# Patient Record
Sex: Female | Born: 1940 | Race: White | Hispanic: No | Smoking: Former smoker
Health system: Southern US, Community
[De-identification: ages and names within clinical notes are randomized; demographics above are authoritative.]

## PROBLEM LIST (undated history)

## (undated) DIAGNOSIS — R112 Nausea with vomiting, unspecified: Secondary | ICD-10-CM

## (undated) DIAGNOSIS — I10 Essential (primary) hypertension: Secondary | ICD-10-CM

## (undated) DIAGNOSIS — T7840XA Allergy, unspecified, initial encounter: Secondary | ICD-10-CM

## (undated) DIAGNOSIS — C801 Malignant (primary) neoplasm, unspecified: Secondary | ICD-10-CM

## (undated) DIAGNOSIS — E039 Hypothyroidism, unspecified: Secondary | ICD-10-CM

## (undated) DIAGNOSIS — R7303 Prediabetes: Secondary | ICD-10-CM

## (undated) DIAGNOSIS — Z9889 Other specified postprocedural states: Secondary | ICD-10-CM

## (undated) DIAGNOSIS — M1990 Unspecified osteoarthritis, unspecified site: Secondary | ICD-10-CM

## (undated) DIAGNOSIS — K8020 Calculus of gallbladder without cholecystitis without obstruction: Secondary | ICD-10-CM

## (undated) DIAGNOSIS — G4730 Sleep apnea, unspecified: Secondary | ICD-10-CM

## (undated) HISTORY — PX: OTHER SURGICAL HISTORY: SHX169

## (undated) HISTORY — PX: BREAST SURGERY: SHX581

## (undated) HISTORY — PX: THYROIDECTOMY: SHX17

## (undated) HISTORY — DX: Allergy, unspecified, initial encounter: T78.40XA

## (undated) HISTORY — DX: Malignant (primary) neoplasm, unspecified: C80.1

## (undated) HISTORY — PX: TONSILLECTOMY: SUR1361

---

## 1998-01-06 ENCOUNTER — Other Ambulatory Visit: Admission: RE | Admit: 1998-01-06 | Discharge: 1998-01-06 | Payer: Self-pay | Admitting: Family Medicine

## 1998-02-04 ENCOUNTER — Other Ambulatory Visit: Admission: RE | Admit: 1998-02-04 | Discharge: 1998-02-04 | Payer: Self-pay | Admitting: Obstetrics & Gynecology

## 1999-02-23 ENCOUNTER — Other Ambulatory Visit: Admission: RE | Admit: 1999-02-23 | Discharge: 1999-02-23 | Payer: Self-pay | Admitting: *Deleted

## 2003-10-28 ENCOUNTER — Other Ambulatory Visit: Admission: RE | Admit: 2003-10-28 | Discharge: 2003-10-28 | Payer: Self-pay | Admitting: Internal Medicine

## 2005-10-31 ENCOUNTER — Other Ambulatory Visit: Admission: RE | Admit: 2005-10-31 | Discharge: 2005-10-31 | Payer: Self-pay | Admitting: Internal Medicine

## 2008-05-30 ENCOUNTER — Encounter: Admission: RE | Admit: 2008-05-30 | Discharge: 2008-05-30 | Payer: Self-pay | Admitting: Endocrinology

## 2009-10-23 IMAGING — US US SOFT TISSUE HEAD/NECK
1 series · 14 of 25 positions shown · non-contrast
Comparison: None

CLINICAL DATA: Thyroid nodule

THYROID ULTRASOUND
TECHNIQUE: Ultrasound examination of the thyroid gland and
adjacent soft tissues was performed.

[Series 1: us soft tissue head/neck · 0.07mm/px · 14 of 29 slices shown]
[im 1/29]
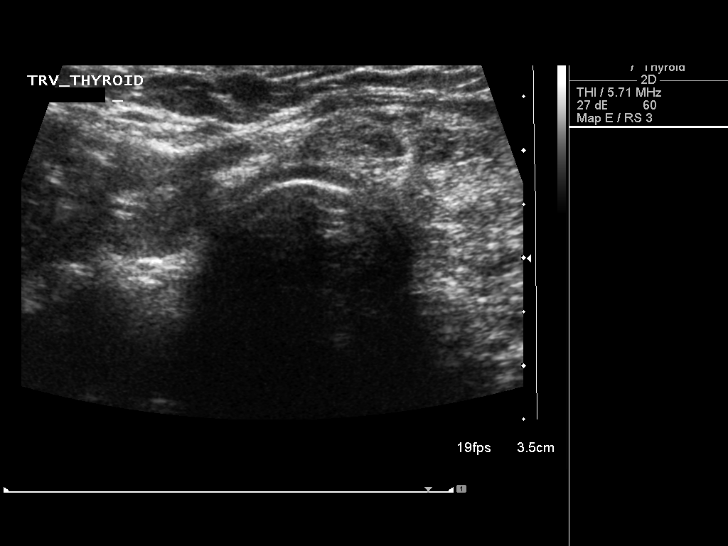
[im 3/29]
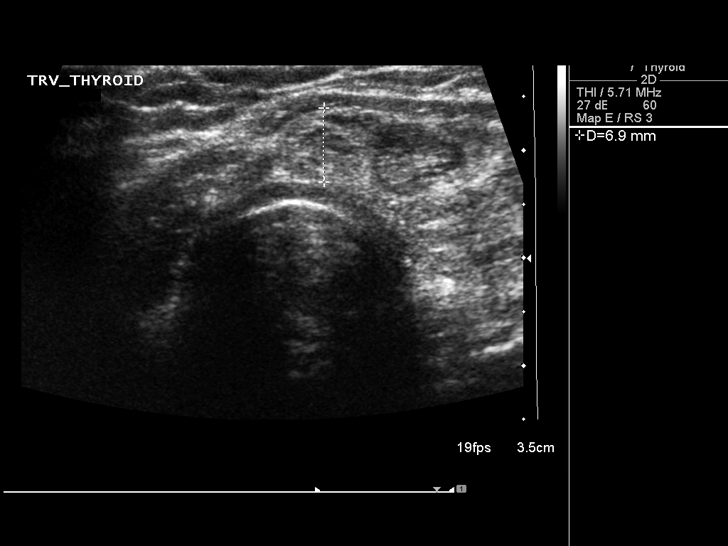
[im 5/29]
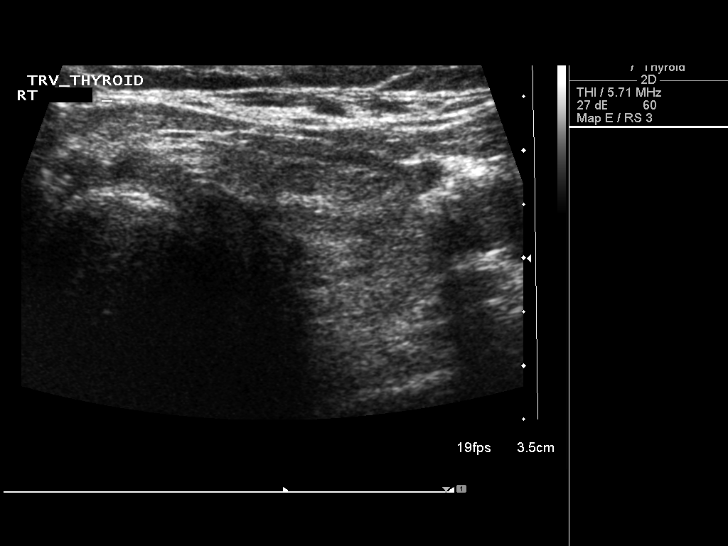
[im 8/29]
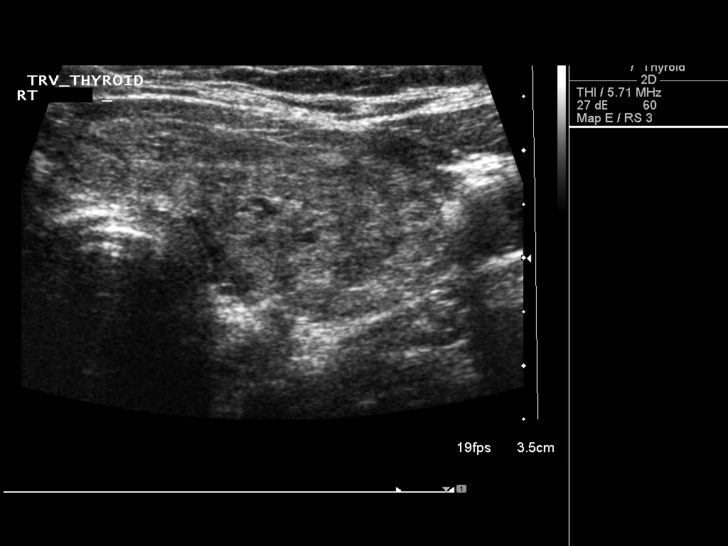
[im 10/29]
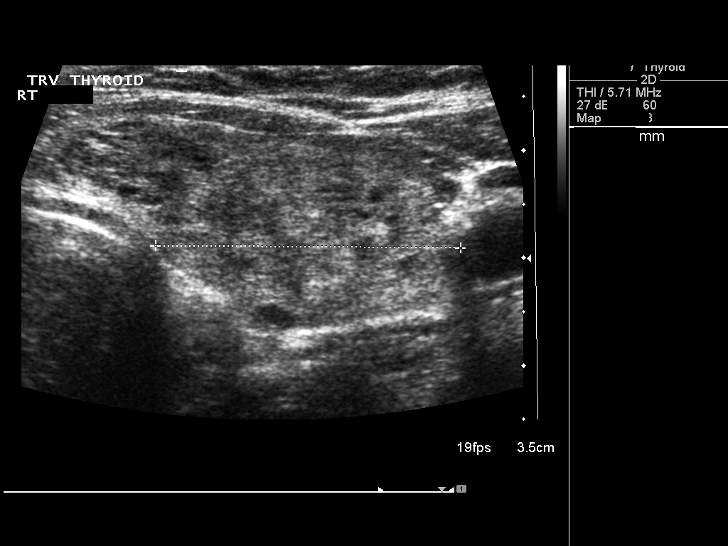
[im 11/29]
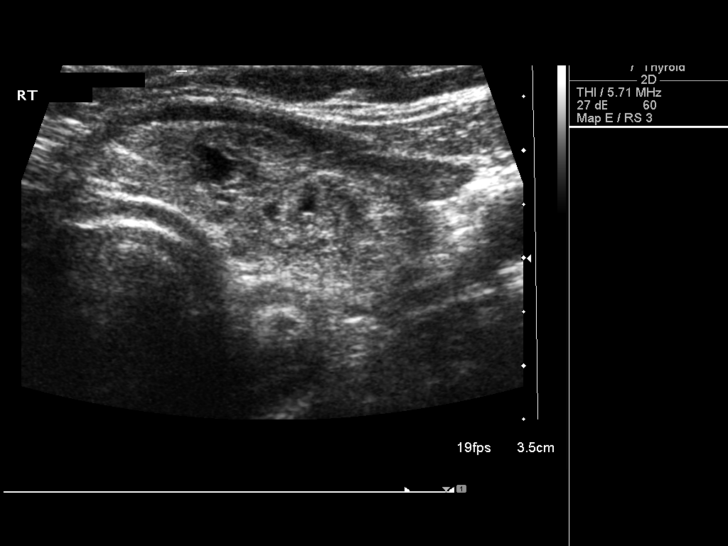
[im 13/29]
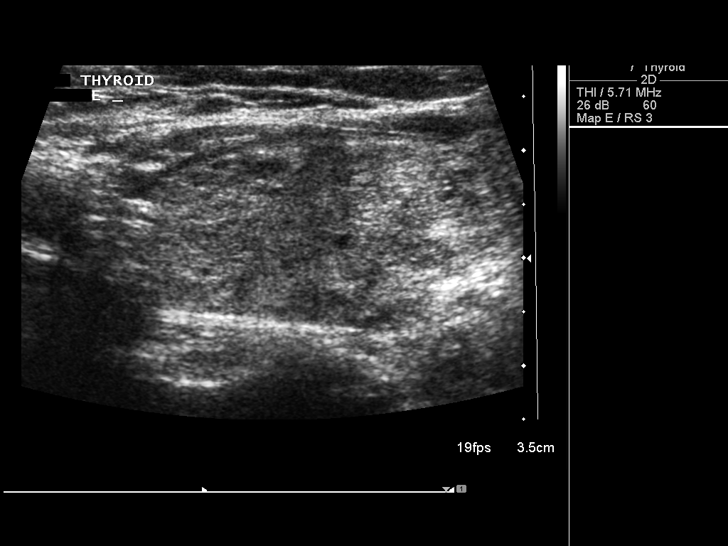
[im 16/29]
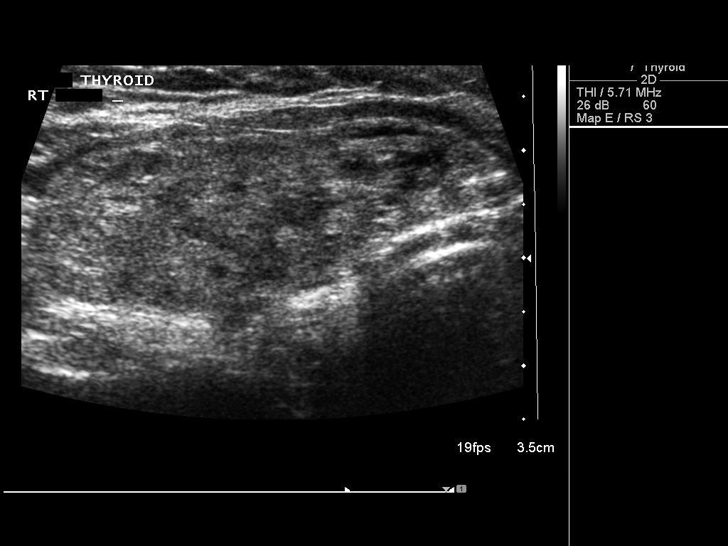
[im 18/29]
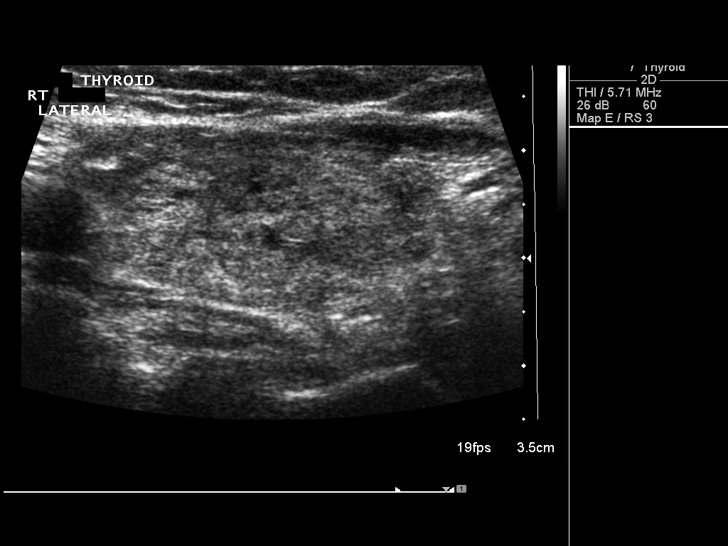
[im 19/29]
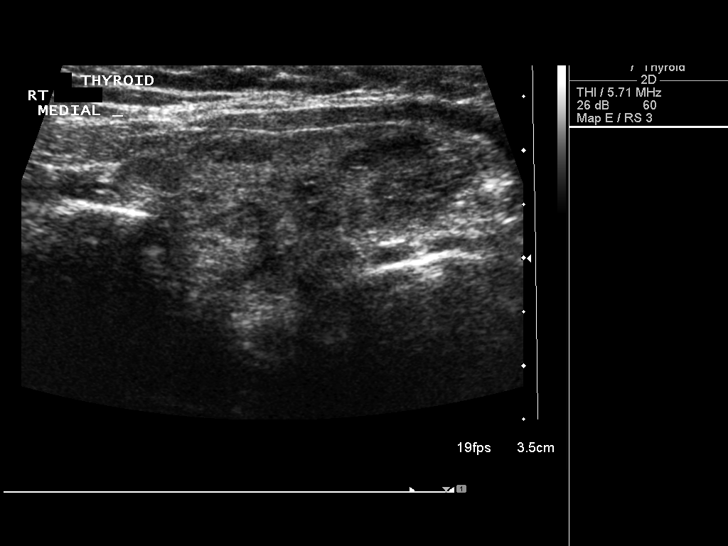
[im 22/29]
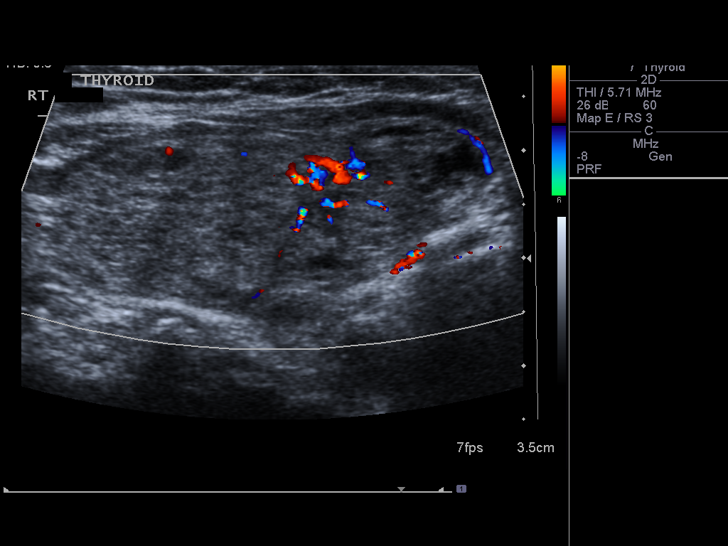
[im 24/29]
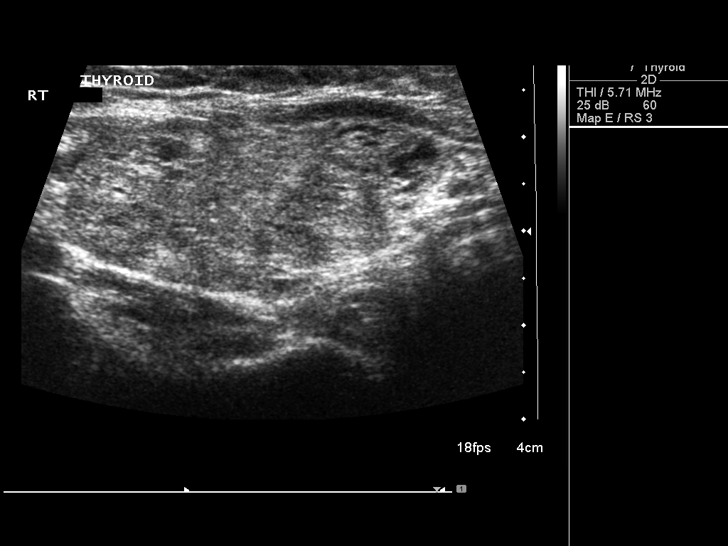
[im 26/29]
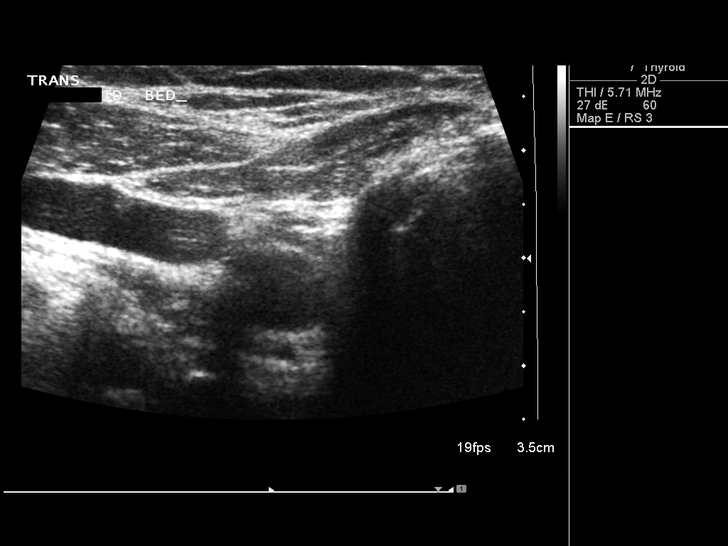
[im 29/29]
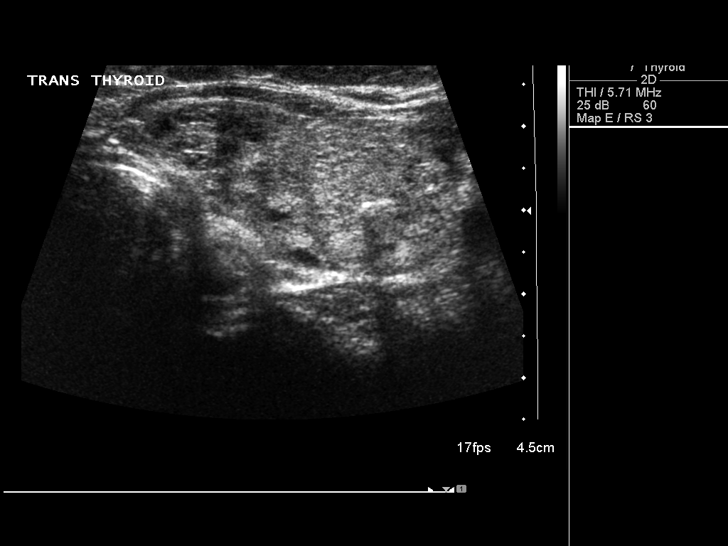

[14 of 25 positions shown; findings below may reference images not displayed]

FINDINGS: The patient had a right lobe excision in 3995.  The left
lobe measures 4.5 x 1.7 x 2.8 cm.  The isthmus measures 7 mm.  The
left lobe is very heterogeneous in appearance but no focal discrete
nodule is seen.  In the region of the right thyroid bed no mass or
nodule is identified.
IMPRESSION: 1.  Status post right lobe thyroidectomy.  No worrisome lesions are
seen in the right thyroid bed.
2.  A very heterogeneous appearance of a slightly enlarged left
lobe.  No discrete focal nodule.

## 2010-08-15 ENCOUNTER — Encounter: Payer: Self-pay | Admitting: Internal Medicine

## 2011-10-03 ENCOUNTER — Ambulatory Visit (INDEPENDENT_AMBULATORY_CARE_PROVIDER_SITE_OTHER): Payer: Medicare Other | Admitting: Family Medicine

## 2011-10-03 VITALS — BP 159/81 | HR 81 | Temp 98.6°F | Resp 18 | Ht 65.2 in | Wt 224.0 lb

## 2011-10-03 DIAGNOSIS — M542 Cervicalgia: Secondary | ICD-10-CM

## 2011-10-03 DIAGNOSIS — S139XXA Sprain of joints and ligaments of unspecified parts of neck, initial encounter: Secondary | ICD-10-CM

## 2011-10-03 DIAGNOSIS — I10 Essential (primary) hypertension: Secondary | ICD-10-CM

## 2011-10-03 DIAGNOSIS — E039 Hypothyroidism, unspecified: Secondary | ICD-10-CM

## 2011-10-03 MED ORDER — CYCLOBENZAPRINE HCL 10 MG PO TABS: 10.0000 mg | ORAL_TABLET | Freq: Two times a day (BID) | ORAL | Status: AC | PRN

## 2011-10-03 NOTE — Progress Notes (Signed)
  Patient Name: Mercedes Khan Date of Birth: 1941-05-30 Medical Record Number: [id number] Gender: female Date of Encounter: 10/03/2011  History of Present Illness:  Mercedes Khan is a 71 y.o. very pleasant female patient who presents with the following:  Has been sleeping on a couch for a few months due to moving in with her daughter and bedroom being set- up. Notes left sided neck pain and upper back/ shoulder pain for the past week.  Has tried massage and ibuprofen which did not help.  Has history of neck arthritis. No acute injury.  Over the weekend she did move to sleeping in a bed again.   Pain causes it to be difficult for her to get comfortable, especially when she lies down at night. No weakness or paraesthesias.    Otherwise she is doing ok and is compliant with her HTN medications- states she has white coat syndrome  Patient Active Problem List  Diagnoses  . Hypertension  . Hypothyroidism   Past Medical History  Diagnosis Date  . Allergy   . Cancer     skin   History reviewed. No pertinent past surgical history. History  Substance Use Topics  . Smoking status: Former Smoker    Quit date: 07/25/1972  . Smokeless tobacco: Never Used  . Alcohol Use: No   History reviewed. No pertinent family history. No Known Allergies  Medication list has been reviewed and updated.  Review of Systems: As per HPI- otherwise negative.   Physical Examination: Filed Vitals:   10/03/11 0939  BP: 159/81  Pulse: 81  Temp: 98.6 F (37 C)  TempSrc: Oral  Resp: 18  Height: 5' 5.2" (1.656 m)  Weight: 224 lb (101.606 kg)   Recheck BP 145/90  Body mass index is 37.05 kg/(m^2).  GEN: WDWN, NAD, Non-toxic, A & O x 3, obese HEENT: Atraumatic, Normocephalic. Neck supple. No masses, No LAD.  TM, oropharynx wnl Neck: no swelling or redness, normal ROM laterally, and flex/ extension.  She notes an area of tenderness and tightness to the right of C6/7.  There is no rash or lesion.     Ears and Nose: No external deformity. CV: RRR, No M/G/R. No JVD. No thrill. No extra heart sounds. PULM: CTA B, no wheezes, crackles, rhonchi. No retractions. No resp. distress. No accessory muscle use. EXTR: No c/c/e.  Upper extremities with normal strength, sensation and biceps DTR.   NEURO Normal gait.  PSYCH: Normally interactive. Conversant. Not depressed or anxious appearing.  Calm demeanor.    Assessment and Plan: 1. Neck pain  cyclobenzaprine (FLEXERIL) 10 MG tablet  2. Hypertension    3. Hypothyroidism     Neck strain- trial of flexeril as above- let us know if not better- Sooner if worse.   We may need to do an xray if not better- however she wishes to avoid xrays if at all possible due to thyroid disease

## 2012-04-24 ENCOUNTER — Other Ambulatory Visit: Payer: Self-pay | Admitting: Endocrinology

## 2012-04-24 DIAGNOSIS — E049 Nontoxic goiter, unspecified: Secondary | ICD-10-CM

## 2012-07-30 ENCOUNTER — Ambulatory Visit
Admission: RE | Admit: 2012-07-30 | Discharge: 2012-07-30 | Disposition: A | Discharge status: Home / Self Care | Payer: Medicare Other | Source: Ambulatory Visit | Attending: Endocrinology | Admitting: Endocrinology

## 2012-07-30 DIAGNOSIS — E049 Nontoxic goiter, unspecified: Secondary | ICD-10-CM

## 2012-11-18 ENCOUNTER — Ambulatory Visit (INDEPENDENT_AMBULATORY_CARE_PROVIDER_SITE_OTHER): Payer: Medicare Other | Admitting: Emergency Medicine

## 2012-11-18 VITALS — BP 164/88 | HR 114 | Temp 97.9°F | Resp 16 | Ht 62.5 in | Wt 225.0 lb

## 2012-11-18 DIAGNOSIS — R7309 Other abnormal glucose: Secondary | ICD-10-CM

## 2012-11-18 DIAGNOSIS — R8290 Unspecified abnormal findings in urine: Secondary | ICD-10-CM

## 2012-11-18 DIAGNOSIS — R300 Dysuria: Secondary | ICD-10-CM

## 2012-11-18 DIAGNOSIS — N390 Urinary tract infection, site not specified: Secondary | ICD-10-CM

## 2012-11-18 DIAGNOSIS — R82998 Other abnormal findings in urine: Secondary | ICD-10-CM

## 2012-11-18 DIAGNOSIS — R739 Hyperglycemia, unspecified: Secondary | ICD-10-CM

## 2012-11-18 DIAGNOSIS — E119 Type 2 diabetes mellitus without complications: Secondary | ICD-10-CM

## 2012-11-18 LAB — POCT URINALYSIS DIPSTICK
Nitrite, UA: POSITIVE
Protein, UA: 100
Spec Grav, UA: 1.01
Urobilinogen, UA: 2
pH, UA: 5.5

## 2012-11-18 LAB — POCT UA - MICROSCOPIC ONLY

## 2012-11-18 MED ORDER — CIPROFLOXACIN HCL 250 MG PO TABS: 250.0000 mg | ORAL_TABLET | Freq: Two times a day (BID) | ORAL | Status: DC

## 2012-11-18 NOTE — Patient Instructions (Addendum)
Urinary Tract Infection Urinary tract infections (UTIs) can develop anywhere along your urinary tract. Your urinary tract is your body's drainage system for removing wastes and extra water. Your urinary tract includes two kidneys, two ureters, a bladder, and a urethra. Your kidneys are a pair of bean-shaped organs. Each kidney is about the size of your fist. They are located below your ribs, one on each side of your spine. CAUSES Infections are caused by microbes, which are microscopic organisms, including fungi, viruses, and bacteria. These organisms are so small that they can only be seen through a microscope. Bacteria are the microbes that most commonly cause UTIs. SYMPTOMS  Symptoms of UTIs may vary by age and gender of the patient and by the location of the infection. Symptoms in young women typically include a frequent and intense urge to urinate and a painful, burning feeling in the bladder or urethra during urination. Older women and men are more likely to be tired, shaky, and weak and have muscle aches and abdominal pain. A fever may mean the infection is in your kidneys. Other symptoms of a kidney infection include pain in your back or sides below the ribs, nausea, and vomiting. DIAGNOSIS To diagnose a UTI, your caregiver will ask you about your symptoms. Your caregiver also will ask to provide a urine sample. The urine sample will be tested for bacteria and white blood cells. White blood cells are made by your body to help fight infection. TREATMENT  Typically, UTIs can be treated with medication. Because most UTIs are caused by a bacterial infection, they usually can be treated with the use of antibiotics. The choice of antibiotic and length of treatment depend on your symptoms and the type of bacteria causing your infection. HOME CARE INSTRUCTIONS  If you were prescribed antibiotics, take them exactly as your caregiver instructs you. Finish the medication even if you feel better after you  have only taken some of the medication.  Drink enough water and fluids to keep your urine clear or pale yellow.  Avoid caffeine, tea, and carbonated beverages. They tend to irritate your bladder.  Empty your bladder often. Avoid holding urine for long periods of time.  Empty your bladder before and after sexual intercourse.  After a bowel movement, women should cleanse from front to back. Use each tissue only once. SEEK MEDICAL CARE IF:   You have back pain.  You develop a fever.  Your symptoms do not begin to resolve within 3 days. SEEK IMMEDIATE MEDICAL CARE IF:   You have severe back pain or lower abdominal pain.  You develop chills.  You have nausea or vomiting.  You have continued burning or discomfort with urination. MAKE SURE YOU:   Understand these instructions.  Will watch your condition.  Will get help right away if you are not doing well or get worse. Document Released: 04/20/2005 Document Revised: 01/10/2012 Document Reviewed: 08/19/2011 [employer] Patient Information 2013 [employer].  

## 2012-11-18 NOTE — Progress Notes (Signed)
  Subjective:    Patient ID: Mercedes Khan, female    DOB: [birth year], 72 y.o.   MRN: [id number]  HPI Pt presents to clinic today with symptoms of a UTI that started yesterday afternoon. Last UTI was about 2 years ago. Pt is having dysuria and she got up several times last night to use the restroom. She states that she took 2 AZO pills today to try and relieve her symptoms.     Review of Systems     Objective:   Physical Exam there is no CVA pain. The abdomen is protuberant but there are no masses felt. Results for orders placed in visit on 11/18/12  POCT UA - MICROSCOPIC ONLY      Result Value Range   WBC, Ur, HPF, POC tntc     RBC, urine, microscopic tntc     Bacteria, U Microscopic mod     Mucus, UA 2+     Epithelial cells, urine per micros 2-12     Crystals, Ur, HPF, POC neg     Casts, Ur, LPF, POC neg     Yeast, UA neg    POCT URINALYSIS DIPSTICK      Result Value Range   Color, UA orange     Clarity, UA cloudy     Glucose, UA 100     Bilirubin, UA small     Ketones, UA trace     Spec Grav, UA 1.010     Blood, UA mod     pH, UA 5.5     Protein, UA 100     Urobilinogen, UA 2.0     Nitrite, UA pos     Leukocytes, UA large (3+)     Glucose 179     Assessment & Plan:  Patient here with urinary tract infection. Urine culture to be done. We'll treat with Cipro 250 twice a day for 7 days. She is advised to followup with her PCP regarding her elevated sugar

## 2012-11-21 LAB — URINE CULTURE: Colony Count: >=100000

## 2013-04-10 ENCOUNTER — Other Ambulatory Visit: Payer: Self-pay | Admitting: Endocrinology

## 2013-04-10 DIAGNOSIS — E049 Nontoxic goiter, unspecified: Secondary | ICD-10-CM

## 2013-07-22 ENCOUNTER — Other Ambulatory Visit: Payer: Medicare Other

## 2013-07-22 ENCOUNTER — Ambulatory Visit
Admission: RE | Admit: 2013-07-22 | Discharge: 2013-07-22 | Disposition: A | Discharge status: Home / Self Care | Payer: Medicare Other | Source: Ambulatory Visit | Attending: Endocrinology | Admitting: Endocrinology

## 2013-07-22 ENCOUNTER — Other Ambulatory Visit: Payer: Self-pay | Admitting: Endocrinology

## 2013-07-22 DIAGNOSIS — E049 Nontoxic goiter, unspecified: Secondary | ICD-10-CM

## 2013-10-23 ENCOUNTER — Other Ambulatory Visit: Payer: Medicare Other

## 2014-03-03 ENCOUNTER — Other Ambulatory Visit: Payer: Self-pay | Admitting: Family Medicine

## 2014-03-03 ENCOUNTER — Ambulatory Visit (INDEPENDENT_AMBULATORY_CARE_PROVIDER_SITE_OTHER): Payer: Medicare Other

## 2014-03-03 ENCOUNTER — Ambulatory Visit (INDEPENDENT_AMBULATORY_CARE_PROVIDER_SITE_OTHER): Payer: Medicare Other | Admitting: Family Medicine

## 2014-03-03 VITALS — BP 126/70 | HR 96 | Temp 98.6°F | Resp 20 | Ht 61.5 in | Wt 197.2 lb

## 2014-03-03 DIAGNOSIS — J3489 Other specified disorders of nose and nasal sinuses: Secondary | ICD-10-CM

## 2014-03-03 DIAGNOSIS — R059 Cough, unspecified: Secondary | ICD-10-CM

## 2014-03-03 DIAGNOSIS — R05 Cough: Secondary | ICD-10-CM

## 2014-03-03 DIAGNOSIS — R5381 Other malaise: Secondary | ICD-10-CM

## 2014-03-03 DIAGNOSIS — R5383 Other fatigue: Secondary | ICD-10-CM

## 2014-03-03 DIAGNOSIS — R110 Nausea: Secondary | ICD-10-CM

## 2014-03-03 LAB — POCT CBC
Granulocyte percent: 74.1 %G (ref 37–80)
HCT, POC: 46.6 % (ref 37.7–47.9)
Hemoglobin: 14.9 g/dL (ref 12.2–16.2)
LYMPH, POC: 1.4 (ref 0.6–3.4)
MCH: 28.9 pg (ref 27–31.2)
MCHC: 31.9 g/dL (ref 31.8–35.4)
MCV: 90.5 fL (ref 80–97)
MID (CBC): 0.5 (ref 0–0.9)
MPV: 7.1 fL (ref 0–99.8)
PLATELET COUNT, POC: 254 10*3/uL (ref 142–424)
POC Granulocyte: 5.5 (ref 2–6.9)
POC LYMPH %: 19.2 % (ref 10–50)
POC MID %: 6.7 % (ref 0–12)
RBC: 5.15 M/uL (ref 4.04–5.48)
RDW, POC: 15.5 %
WBC: 7.4 10*3/uL (ref 4.6–10.2)

## 2014-03-03 MED ORDER — BENZONATATE 100 MG PO CAPS: 100.0000 mg | ORAL_CAPSULE | Freq: Three times a day (TID) | ORAL | Status: DC | PRN

## 2014-03-03 MED ORDER — ONDANSETRON 4 MG PO TBDP: 4.0000 mg | ORAL_TABLET | Freq: Three times a day (TID) | ORAL | Status: DC | PRN

## 2014-03-03 NOTE — Progress Notes (Signed)
[facility] [address] [place] [address] [phone]  Date:  03/03/2014   Name:  Mercedes Khan   DOB:  [birth year]   MRN:  [id number]  PCP:  No PCP Per Patient    Chief Complaint: Headache, Cough and Facial Pain   History of Present Illness:  Mercedes Khan is a 73 y.o. very pleasant female patient who presents with the following:  History of HTN, "borderline" DM and hypothyroidism as below. This is managed by her endocrinologist.  She does not have to take any medications for DM- diet control only She is here today with illness over the last 4 days.  She notes sinus pain and pressure, "I know I have a sinus infection."  She was having diarrhea last night.  She thinks she might have eaten something bad.  This am she had a solid stool again.   She felt that she was "choking on phlegm" last night and might "strangle" so she did not eat much.   She does have a cough, no ST.  She has a runny nose.  No stuffy nose. No vomiting, but she has been nauseated. She has tried some ginger for this.    She noted a subjective fever but did not check her temperature.    She is suspicious of x-rays, states her endocrinologist told her that she should not have "too many" due to her thyroid.  She never had thyroid cancer but did have a goiter in the past, s/p thyroidectomy Patient Active Problem List   Diagnosis Date Noted  . Diabetes 11/18/2012  . Hypertension 10/03/2011  . Hypothyroidism 10/03/2011    Past Medical History  Diagnosis Date  . Allergy   . Cancer     skin    History reviewed. No pertinent past surgical history.  History  Substance Use Topics  . Smoking status: Former Smoker    Quit date: 07/25/1972  . Smokeless tobacco: Never Used  . Alcohol Use: No    History reviewed. No pertinent family history.  No Known Allergies  Medication list has been reviewed and updated.  Current Outpatient Prescriptions on File Prior to Visit  Medication  Sig Dispense Refill  . lisinopril (PRINIVIL,ZESTRIL) 30 MG tablet Take 30 mg by mouth daily.      Marland Kitchen thyroid (ARMOUR) 120 MG tablet Take by mouth daily.       No current facility-administered medications on file prior to visit.    Review of Systems:  As per HPI- otherwise negative.   Physical Examination: Filed Vitals:   03/03/14 0837  BP: 126/70  Pulse: 108  Temp: 98.6 F (37 C)  Resp: 20   Filed Vitals:   03/03/14 0837  Height: 5' 1.5" (1.562 m)  Weight: 197 lb 3.2 oz (89.449 kg)   Body mass index is 36.66 kg/(m^2). Ideal Body Weight: Weight in (lb) to have BMI = 25: 134.2  GEN: WDWN, NAD, Non-toxic, A & O x 3, obese, looks well  HEENT: Atraumatic, Normocephalic. Neck supple. No masses, No LAD. Bilateral TM wnl, oropharynx normal.  PEERL,EOMI.   Ears and Nose: No external deformity. CV: RRR, No M/G/R. No JVD. No thrill. No extra heart sounds. PULM: CTA B, no wheezes, crackles, rhonchi. No retractions. No resp. distress. No accessory muscle use. ABD: S, NT, ND EXTR: No c/c/e NEURO Normal gait.  PSYCH: Normally interactive. Conversant. Not depressed or anxious appearing.  Calm demeanor.   [facility] reading (PRIMARY) by  Dr. Lorelei Pont. CXR: pt  refused AP as she is concerned about radiation to her thyroid.   Lateral appears normal. CHEST - 1 VIEW  COMPARISON: None.  FINDINGS: Single lateral view of the chest reveals no acute abnormality. The patient refused a frontal film  IMPRESSION: No acute abnormality is noted although this does not constitute a diagnostic chest x-ray given the lack of a frontal film.     Results for orders placed in visit on 03/03/14  POCT CBC      Result Value Ref Range   WBC 7.4  4.6 - 10.2 K/uL   Lymph, poc 1.4  0.6 - 3.4   POC LYMPH PERCENT 19.2  10 - 50 %L   MID (cbc) 0.5  0 - 0.9   POC MID % 6.7  0 - 12 %M   POC Granulocyte 5.5  2 - 6.9   Granulocyte percent 74.1  37 - 80 %G   RBC 5.15  4.04 - 5.48 M/uL   Hemoglobin 14.9  12.2 - 16.2  g/dL   HCT, POC 46.6  37.7 - 47.9 %   MCV 90.5  80 - 97 fL   MCH, POC 28.9  27 - 31.2 pg   MCHC 31.9  31.8 - 35.4 g/dL   RDW, POC 15.5     Platelet Count, POC 254  142 - 424 K/uL   MPV 7.1  0 - 99.8 fL    Assessment and Plan: Cough - Plan: POCT CBC, DG Chest 2 View, benzonatate (TESSALON) 100 MG capsule  Other malaise and fatigue  Sinus pain  Nausea alone - Plan: ondansetron (ZOFRAN ODT) 4 MG disintegrating tablet  Here today with 4 days of nasal congestion, nasal drainage, cough an some diarrhea.  Explained that this constellation of sx, especially with a normal wbc count, likely indicates a viral infection.  Tessalon perles and zofran as needed- asked her to follow-up if she is not better in the next few days, Sooner if worse.     Signed Lamar Blinks, MD

## 2014-03-03 NOTE — Patient Instructions (Signed)
Use some tylenol or ibuprofen for your headache, and PLAIN mucinex for congestion.  Use the zofran as needed for nausea and the tessalon for cough.  Let me know if you are not feeling better in the next few days- Sooner if worse.

## 2016-11-10 ENCOUNTER — Other Ambulatory Visit: Payer: Self-pay | Admitting: Gastroenterology

## 2016-11-10 DIAGNOSIS — R7989 Other specified abnormal findings of blood chemistry: Secondary | ICD-10-CM

## 2016-11-10 DIAGNOSIS — R945 Abnormal results of liver function studies: Principal | ICD-10-CM

## 2016-12-02 ENCOUNTER — Ambulatory Visit
Admission: RE | Admit: 2016-12-02 | Discharge: 2016-12-02 | Disposition: A | Discharge status: Home / Self Care | Payer: Medicare Other | Source: Ambulatory Visit | Attending: Gastroenterology | Admitting: Gastroenterology

## 2016-12-02 ENCOUNTER — Other Ambulatory Visit: Payer: BC Managed Care – PPO

## 2016-12-02 DIAGNOSIS — R945 Abnormal results of liver function studies: Principal | ICD-10-CM

## 2016-12-02 DIAGNOSIS — R7989 Other specified abnormal findings of blood chemistry: Secondary | ICD-10-CM

## 2016-12-09 ENCOUNTER — Encounter: Payer: Self-pay | Admitting: Registered"

## 2016-12-09 ENCOUNTER — Encounter: Payer: Medicare Other | Attending: Endocrinology | Admitting: Registered"

## 2016-12-09 DIAGNOSIS — E119 Type 2 diabetes mellitus without complications: Secondary | ICD-10-CM | POA: Diagnosis not present

## 2016-12-09 DIAGNOSIS — Z713 Dietary counseling and surveillance: Secondary | ICD-10-CM | POA: Diagnosis not present

## 2016-12-09 NOTE — Patient Instructions (Addendum)
Aim for 2-3 Carb Choices per meal (30-45 grams)  Aim for 0-1 Carbs per snack if hungry  Include protein in moderation with your meals and snacks Consider reading food labels for Total Carbohydrate and Sat Fat Grams of foods Continue with your daily activity as tolerated Consider checking BG at alternate times per day as directed by MD   If you have the 4 oz of juice (cherry or pomegranate) with protein, can check your BG 1 to 1 1/1 hours later to see effect on your blood sugar. Try to keep your BG under 180.  Consider ways to manage stress in your life such as a counselor, yoga, music, deep breathing. May help with controlling blood sugar.

## 2016-12-09 NOTE — Progress Notes (Signed)
Visit Type: First/Initial  Appt. Start Time: 0930 Appt. End Time: 1100  12/09/2016  Mercedes Khan, identified by name and date of birth, is a 76 y.o. female with a diagnosis of Diabetes: Type 2.   ASSESSMENT Pt states she has had pre-diabetes and controlled with exercise & diet. Pt states for a couple of months before the last A1c she started eating 5-9 prunes per day for because she read prunes are good for bone regeneration. Pt states she will not take metformin because it causes a long list of side effects including bladder cancer.   Pt reports for 6 weeks she followed a meat and green veggie diet prescribed by her doctor and lost 11 lbs. Pt reports yesterday she had carb cravings and had bread and candy bars. RD provided education on portion size for carbs to help reduce carb cravings.   Pt states she was using stevia but her GI doctor told her to stop using due to adverse effects on kidneys.  Pt states she has a lot of stress and not sure why she has started worrying about everything so much.  Diabetes Self-Management Education - 12/09/16 0942      Visit Information   Visit Type First/Initial     Initial Visit   Diabetes Type Type 2   Are you currently following a meal plan? Yes   What type of meal plan do you follow? meat & green veggie (6 weeks ago)   Are you taking your medications as prescribed? Not on Medications   Date Diagnosed A1C up to 8.7 10/20/16 per chart     Health Coping   How would you rate your overall health? Good     Psychosocial Assessment   Patient Belief/Attitude about Diabetes Motivated to manage diabetes   Preferred Learning Style Auditory   How often do you need to have someone help you when you read instructions, pamphlets, or other written materials from your doctor or pharmacy? 1 - Never   What is the last grade level you completed in school? 12     Complications   Last HgB A1C per patient/outside source 8.7 %   How often do you check your  blood sugar? --  periodically    Fasting Blood glucose range (mg/dL) 70-129   Have you had a dilated eye exam in the past 12 months? No   Have you had a dental exam in the past 12 months? Yes   Are you checking your feet? No     Dietary Intake   Breakfast eggs, ham, cheese OR   10   Snack (morning) goat cheese on celery   Lunch salad, cheese a lot of caesar dressing, parmesean   Snack (afternoon) none OR nuts   Dinner hamburger patties, guacomole from [employer], veggies,  4 pm   Snack (evening) none OR cucumbers in vinegar   Beverage(s) water, coffee with non-dairy creamer     Exercise   Exercise Type Light (walking / raking leaves)   How many days per week to you exercise? 6  not in the last week   How many minutes per day do you exercise? 30   Total minutes per week of exercise 180     Patient Education   Previous Diabetes Education No   Disease state  Factors that contribute to the development of diabetes   Nutrition management  Role of diet in the treatment of diabetes and the relationship between the three main macronutrients and blood glucose  level;Carbohydrate counting;Reviewed blood glucose goals for pre and post meals and how to evaluate the patients' food intake on their blood glucose level.   Physical activity and exercise  Role of exercise on diabetes management, blood pressure control and cardiac health.   Monitoring Purpose and frequency of SMBG.   Psychosocial adjustment Role of stress on diabetes;Identified and addressed patients feelings and concerns about diabetes     Individualized Goals (developed by patient)   Nutrition General guidelines for healthy choices and portions discussed   Physical Activity Exercise 5-7 days per week   Monitoring  test blood glucose pre and post meals as discussed     Outcomes   Expected Outcomes Demonstrated interest in learning. Expect positive outcomes   Future DMSE 4-6 wks   Program Status Not Completed     Individualized  Plan for Diabetes Self-Management Training:   Learning Objective:  Patient will have a greater understanding of diabetes self-management. Patient education plan is to attend individual and/or group sessions per assessed needs and concerns.  Patient Instructions  Aim for 2-3 Carb Choices per meal (30-45 grams)  Aim for 0-1 Carbs per snack if hungry  Include protein in moderation with your meals and snacks Consider reading food labels for Total Carbohydrate and Sat Fat Grams of foods Continue with your daily activity as tolerated Consider checking BG at alternate times per day as directed by MD   If you have the 4 oz of juice (cherry or pomegranate) with protein, can check your BG 1 to 1 1/1 hours later to see effect on your blood sugar. Try to keep your BG under 180.  Consider ways to manage stress in your life such as a counselor, yoga, music, deep breathing. May help with controlling blood sugar.  Expected Outcomes:  Demonstrated interest in learning. Expect positive outcomes  Education material provided: Living Well with Diabetes, My Plate and Carbohydrate counting sheet, Types of Fat  If problems or questions, patient to contact team via:  Phone and MyChart  Future DSME appointment: 4-6 wks

## 2017-01-24 ENCOUNTER — Other Ambulatory Visit: Payer: Self-pay | Admitting: Surgery

## 2017-02-02 ENCOUNTER — Encounter (HOSPITAL_BASED_OUTPATIENT_CLINIC_OR_DEPARTMENT_OTHER): Payer: Self-pay | Admitting: *Deleted

## 2017-02-02 NOTE — Progress Notes (Signed)
Coming Monday for EKG. Bring all medications day of surgery.

## 2017-02-06 ENCOUNTER — Encounter (HOSPITAL_BASED_OUTPATIENT_CLINIC_OR_DEPARTMENT_OTHER)
Admission: RE | Admit: 2017-02-06 | Discharge: 2017-02-06 | Disposition: A | Discharge status: Home / Self Care | Payer: Medicare Other | Source: Ambulatory Visit | Attending: Surgery | Admitting: Surgery

## 2017-02-06 ENCOUNTER — Other Ambulatory Visit: Payer: Self-pay

## 2017-02-06 DIAGNOSIS — Z01818 Encounter for other preprocedural examination: Secondary | ICD-10-CM | POA: Diagnosis not present

## 2017-02-06 DIAGNOSIS — I10 Essential (primary) hypertension: Secondary | ICD-10-CM | POA: Diagnosis not present

## 2017-02-09 ENCOUNTER — Ambulatory Visit (INDEPENDENT_AMBULATORY_CARE_PROVIDER_SITE_OTHER): Payer: Medicare Other

## 2017-02-09 ENCOUNTER — Ambulatory Visit (INDEPENDENT_AMBULATORY_CARE_PROVIDER_SITE_OTHER): Payer: Medicare Other | Admitting: Family Medicine

## 2017-02-09 ENCOUNTER — Encounter: Payer: Self-pay | Admitting: Family Medicine

## 2017-02-09 VITALS — BP 128/70 | HR 75 | Temp 98.3°F | Resp 16 | Ht 61.75 in | Wt 200.2 lb

## 2017-02-09 DIAGNOSIS — M25572 Pain in left ankle and joints of left foot: Secondary | ICD-10-CM

## 2017-02-09 DIAGNOSIS — W19XXXA Unspecified fall, initial encounter: Secondary | ICD-10-CM | POA: Diagnosis not present

## 2017-02-09 DIAGNOSIS — Z8739 Personal history of other diseases of the musculoskeletal system and connective tissue: Secondary | ICD-10-CM | POA: Diagnosis not present

## 2017-02-09 LAB — POCT CBC
GRANULOCYTE PERCENT: 68.2 % (ref 37–80)
HEMATOCRIT: 41.3 % (ref 37.7–47.9)
Hemoglobin: 14.1 g/dL (ref 12.2–16.2)
Lymph, poc: 2.5 (ref 0.6–3.4)
MCH: 30.8 pg (ref 27–31.2)
MCHC: 34.2 g/dL (ref 31.8–35.4)
MCV: 90 fL (ref 80–97)
MID (CBC): 0.4 (ref 0–0.9)
MPV: 7.2 fL (ref 0–99.8)
PLATELET COUNT, POC: 259 10*3/uL (ref 142–424)
POC GRANULOCYTE: 6.3 (ref 2–6.9)
POC LYMPH %: 27.5 % (ref 10–50)
POC MID %: 4.3 %M (ref 0–12)
RBC: 4.59 M/uL (ref 4.04–5.48)
RDW, POC: 13.4 %
WBC: 9.2 10*3/uL (ref 4.6–10.2)

## 2017-02-09 NOTE — Progress Notes (Signed)
Patient ID: Mercedes Khan, female    DOB: 11/11/1940  Age: 76 y.o. MRN: [id number]  Chief Complaint  Patient presents with  . fell    on Saturday 02/04/17, hurt left foot    Subjective:   76 year old lady who was grabbing up her dog to keep it from having an encounter with another dog, and she slipped and fell. This was last Saturday, 5 days ago. She scraped her right knee and strained her right hand little bit, but initially did not realize that her left foot was giving any problems. It was the next day or so that it started hurting, and it is continued to hurt. Monday or so she started getting redness in the medial aspect of the left foot, just distal to the ankle. It is very tender there. She can walk on it, but it is painful.  She was scheduled for a laparoscopic cholecystectomy for today, but canceled that  Current allergies, medications, problem list, past/family and social histories reviewed.  Objective:  BP 128/70   Pulse 75   Temp 98.3 F (36.8 C) (Oral)   Resp 16   Ht 5' 1.75" (1.568 m)   Wt 200 lb 3.2 oz (90.8 kg)   SpO2 97%   BMI 36.91 kg/m   Her right knee has a little few little scratches that are healed up. The right hand does not seem to be giving her problems, and she has good grip without pain. The left foot in the area identified above is erythematous. It is very tender on the medial aspect of the foot just beyond the ankle joint. Between the medial malleolus and the first metacarpal area.  Assessment & Plan:   Assessment: 1. Pain of joint of left ankle and foot   2. Fall, initial encounter   3. History of osteopenia       Plan: Uncertain why she developed leg pain in that area, but will get x-rays, CBC for possible infection, and uric acid to make certain this is not a gout flare triggered by trauma.  Orders Placed This Encounter  Procedures  . DG Ankle Complete Left    Standing Status:   Future    Number of Occurrences:   1    Standing Expiration  Date:   02/09/2018    Order Specific Question:   Reason for Exam (SYMPTOM  OR DIAGNOSIS REQUIRED)    Answer:   fell/ later developed left foot pain, just distal to medial aspect of ankle    Order Specific Question:   Preferred imaging location?    Answer:   External  . DG Foot 2 Views Left    Standing Status:   Future    Number of Occurrences:   1    Standing Expiration Date:   02/09/2018    Order Specific Question:   Reason for Exam (SYMPTOM  OR DIAGNOSIS REQUIRED)    Answer:   fell/ later developed left foot pain, just distal to medial aspect of ankle    Order Specific Question:   Preferred imaging location?    Answer:   External  . Uric acid  . Ambulatory referral to Orthopedic Surgery    Referral Priority:   Routine    Referral Type:   Surgical    Referral Reason:   Specialty Services Required    Referred to Provider:   Wylene Simmer, MD    Requested Specialty:   Orthopedic Surgery    Number of Visits Requested:   1  .  POCT CBC    No orders of the defined types were placed in this encounter.  Results for orders placed or performed in visit on 02/09/17  POCT CBC  Result Value Ref Range   WBC 9.2 4.6 - 10.2 K/uL   Lymph, poc 2.5 0.6 - 3.4   POC LYMPH PERCENT 27.5 10 - 50 %L   MID (cbc) 0.4 0 - 0.9   POC MID % 4.3 0 - 12 %M   POC Granulocyte 6.3 2 - 6.9   Granulocyte percent 68.2 37 - 80 %G   RBC 4.59 4.04 - 5.48 M/uL   Hemoglobin 14.1 12.2 - 16.2 g/dL   HCT, POC 41.3 37.7 - 47.9 %   MCV 90.0 80 - 97 fL   MCH, POC 30.8 27 - 31.2 pg   MCHC 34.2 31.8 - 35.4 g/dL   RDW, POC 13.4 %   Platelet Count, POC 259 142 - 424 K/uL   MPV 7.2 0 - 99.8 fL         Patient Instructions   Wear the postop shoe when up and around to try and keep the foot stiff.  If redness is increasing please return  Try to rest foot as much as you can.  Take ibuprofen 2-3 pills 3 times daily if needed for pain. Take with food.  Return if needed. However referral is being made to an orthopedic  foot specialist to assess this further. You should hear from our referrals desk by early in the week. If you do not hear from anyone by Monday please call and talk to the referral's desk and ask if the referral has been made.    IF you received an x-ray today, you will receive an invoice from [facility]. Please contact [facility] at [phone] with questions or concerns regarding your invoice.   IF you received labwork today, you will receive an invoice from [facility]. Please contact [facility] at [phone] with questions or concerns regarding your invoice.   Our billing staff will not be able to assist you with questions regarding bills from these companies.  You will be contacted with the lab results as soon as they are available. The fastest way to get your results is to activate your My Chart account. Instructions are located on the last page of this paperwork. If you have not heard from [place] regarding the results in 2 weeks, please contact this office.        Return if symptoms worsen or fail to improve.   Kaesha Kirsch, MD 02/09/2017

## 2017-02-09 NOTE — Patient Instructions (Addendum)
Wear the postop shoe when up and around to try and keep the foot stiff.  If redness is increasing please return  Try to rest foot as much as you can.  Take ibuprofen 2-3 pills 3 times daily if needed for pain. Take with food.  Return if needed. However referral is being made to an orthopedic foot specialist to assess this further. You should hear from our referrals desk by early in the week. If you do not hear from anyone by Monday please call and talk to the referral's desk and ask if the referral has been made.    IF you received an x-ray today, you will receive an invoice from [facility]. Please contact [facility] at [phone] with questions or concerns regarding your invoice.   IF you received labwork today, you will receive an invoice from [facility]. Please contact [facility] at [phone] with questions or concerns regarding your invoice.   Our billing staff will not be able to assist you with questions regarding bills from these companies.  You will be contacted with the lab results as soon as they are available. The fastest way to get your results is to activate your My Chart account. Instructions are located on the last page of this paperwork. If you have not heard from [place] regarding the results in 2 weeks, please contact this office.

## 2017-02-10 LAB — URIC ACID: URIC ACID: 5 mg/dL (ref 2.5–7.1)

## 2017-03-03 ENCOUNTER — Encounter (HOSPITAL_BASED_OUTPATIENT_CLINIC_OR_DEPARTMENT_OTHER): Payer: Self-pay | Admitting: *Deleted

## 2017-03-03 ENCOUNTER — Encounter: Payer: Self-pay | Admitting: Family Medicine

## 2017-03-03 ENCOUNTER — Ambulatory Visit (INDEPENDENT_AMBULATORY_CARE_PROVIDER_SITE_OTHER): Payer: Medicare Other | Admitting: Family Medicine

## 2017-03-03 VITALS — BP 138/80 | HR 87 | Temp 98.4°F | Resp 16 | Ht 61.75 in | Wt 199.0 lb

## 2017-03-03 DIAGNOSIS — R300 Dysuria: Secondary | ICD-10-CM | POA: Diagnosis not present

## 2017-03-03 DIAGNOSIS — R350 Frequency of micturition: Secondary | ICD-10-CM

## 2017-03-03 DIAGNOSIS — N3001 Acute cystitis with hematuria: Secondary | ICD-10-CM

## 2017-03-03 LAB — POC MICROSCOPIC URINALYSIS (UMFC): Mucus: ABSENT

## 2017-03-03 LAB — POCT URINALYSIS DIP (MANUAL ENTRY)
Bilirubin, UA: NEGATIVE
Glucose, UA: NEGATIVE mg/dL
Ketones, POC UA: NEGATIVE mg/dL
Nitrite, UA: NEGATIVE
Protein Ur, POC: NEGATIVE mg/dL
Spec Grav, UA: 1.01 (ref 1.010–1.025)
Urobilinogen, UA: 0.2 E.U./dL
pH, UA: 6 (ref 5.0–8.0)

## 2017-03-03 MED ORDER — CEPHALEXIN 500 MG PO CAPS: 500.0000 mg | ORAL_CAPSULE | Freq: Two times a day (BID) | ORAL | 0 refills | Status: AC

## 2017-03-03 NOTE — Progress Notes (Signed)
8/10/201810:39 AM  Mercedes Khan Jan 08, 1941, 76 y.o. female [id number]  Chief Complaint  Patient presents with  . Dysuria  . Urinary Frequency    HPI:   Patient is a 76 y.o. female with past medical history per below and significant for HTN and DM who presents today for 3 days of urinary frequency and dysuria. She reports symptoms have been worsening despite increased fluids and cranberry use. She reports previous h/o pre-DM, last saw endo in April, A1c 8.7, feels it was related to sign consumption of prunes. Does not check her cbgs., Denies any gross hematuria, fever, chills, malaise, nausea or diarrhea.  Last UTI in 2016, + N/L, no urine cultures done.  No recent abx use. Denies any vaginal discharge, puritus abnormal odors or discomfort.  Very stressed and worried as she is scheduled for an elective GB surgery next week.   Depression screen [facility] 2/9 03/03/2017 02/09/2017 12/09/2016  Decreased Interest 0 0 0  Down, Depressed, Hopeless 0 0 0  PHQ - 2 Score 0 0 0    No Known Allergies  Current Outpatient Prescriptions on File Prior to Visit  Medication Sig Dispense Refill  . lisinopril (PRINIVIL,ZESTRIL) 30 MG tablet Take 30 mg by mouth daily.    . Cholecalciferol (VITAMIN D PO) Take by mouth.     No current facility-administered medications on file prior to visit.     Past Medical History:  Diagnosis Date  . Allergy   . Arthritis   . Cancer ([place])    skin  . Hypertension   . Hypothyroidism   . PONV (postoperative nausea and vomiting)   . Pre-diabetes    Controlled with diet and exercise  . Sleep apnea    use to wear a mask, pt took herself off     Past Surgical History:  Procedure Laterality Date  . BREAST SURGERY     cyst removed from left breast  . THYROIDECTOMY     On the right side  . TONSILLECTOMY    . Wisdom teeth extracted      Social History  Substance Use Topics  . Smoking status: Former Smoker    Types: Cigarettes    Quit date: 07/25/1972  .  Smokeless tobacco: Never Used     Comment: Quit smoking years ago  . Alcohol use Yes     Comment: Once every two or three years    No family history on file.  Review of Systems  Constitutional: Negative for chills, fever and malaise/fatigue.  Gastrointestinal: Negative for abdominal pain, diarrhea, nausea and vomiting.  Genitourinary: Positive for dysuria and frequency. Negative for flank pain, hematuria and urgency.    OBJECTIVE:  Vitals:   03/03/17 0938  Weight: 199 lb (90.3 kg)  Height: 5' 1.75" (1.568 m)    Physical Exam  Constitutional: She is well-developed, well-nourished, and in no distress.  HENT:  Head: Normocephalic.  Eyes: Pupils are equal, round, and reactive to light. EOM are normal.  Cardiovascular: Normal rate, regular rhythm and normal heart sounds.  Exam reveals no gallop and no friction rub.   No murmur heard. Pulmonary/Chest: Breath sounds normal. She has no wheezes. She has no rales. She exhibits no tenderness.  Abdominal: Soft. She exhibits no distension and no mass. There is no tenderness. There is no rebound and no guarding.  Skin: Skin is warm and dry.  Negative CVA tenderness  Results for orders placed or performed in visit on 03/03/17  POCT urinalysis dipstick  Result Value Ref  Range   Color, UA yellow yellow   Clarity, UA clear clear   Glucose, UA negative negative mg/dL   Bilirubin, UA negative negative   Ketones, POC UA negative negative mg/dL   Spec Grav, UA 1.010 1.010 - 1.025   Blood, UA trace-lysed (A) negative   pH, UA 6.0 5.0 - 8.0   Protein Ur, POC negative negative mg/dL   Urobilinogen, UA 0.2 0.2 or 1.0 E.U./dL   Nitrite, UA Negative Negative   Leukocytes, UA Moderate (2+) (A) Negative  POCT Microscopic Urinalysis ([facility])  Result Value Ref Range   WBC,UR,HPF,POC Few (A) None WBC/hpf   RBC,UR,HPF,POC Few (A) None RBC/hpf   Bacteria Many (A) None, Too numerous to count   Mucus Absent Absent   Epithelial Cells, UR Per  Microscopy Moderate (A) None, Too numerous to count cells/hpf    No results found.  ASSESSMENT and PLAN:  Problem List Items Addressed This Visit    None    Visit Diagnoses    Dysuria    -  Primary   Relevant Orders   POCT urinalysis dipstick (Completed)   POCT Microscopic Urinalysis ([facility]) (Completed)   Urinary frequency       Relevant Orders   POCT urinalysis dipstick (Completed)   POCT Microscopic Urinalysis ([facility]) (Completed)   Acute cystitis with hematuria       Relevant Medications   cephALEXin (KEFLEX) 500 MG capsule   Other Relevant Orders   Urine Culture     1. Dysuria   2. Urinary frequency   3. Acute cystitis with hematuria    Patient with symptoms suggestive of UTI and bacteria on microscopy. Treating for UTI. Discussed supporitive care and new abx. Sending for culture given upcoming surgery in case sx do not resolve with current regime. Discussed signs and symptoms for which to seek immediate medical care. RTC is not getting better or getting worse.   Orders Placed This Encounter  Procedures  . Urine Culture  . POCT urinalysis dipstick  . POCT Microscopic Urinalysis ([facility])   Meds ordered this encounter  Medications  . thyroid (ARMOUR) 30 MG tablet    Sig: Take 30 mg by mouth daily.  . cephALEXin (KEFLEX) 500 MG capsule    Sig: Take 1 capsule (500 mg total) by mouth 2 (two) times daily.    Dispense:  10 capsule    Refill:  [place], MD [facility] at [place], [place] [address] Ph.  [phone] Fax [phone]

## 2017-03-03 NOTE — Patient Instructions (Signed)
     IF you received an x-ray today, you will receive an invoice from [facility]. Please contact [facility] at [phone] with questions or concerns regarding your invoice.   IF you received labwork today, you will receive an invoice from [facility]. Please contact [facility] at [phone] with questions or concerns regarding your invoice.   Our billing staff will not be able to assist you with questions regarding bills from these companies.  You will be contacted with the lab results as soon as they are available. The fastest way to get your results is to activate your My Chart account. Instructions are located on the last page of this paperwork. If you have not heard from us regarding the results in 2 weeks, please contact this office.     

## 2017-03-08 LAB — URINE CULTURE

## 2017-03-08 NOTE — H&P (Signed)
Mercedes Khan  Location: [facility] Patient #: [id number] DOB: 05/24/1941 Divorced / Language: Mercedes Khan / Race: White Female   History of Present Illness  Patient words: New-Gallbladder.  The patient is a 76 year old female who presents for evaluation of gall stones. This is a pleasant patient referred by Dr. Collene Mares for evaluation of cholelithiasis. She was actually referred to Dr. Collene Mares for elevated liver function tests. She had multiple episodes of elevated liver function tests which have been asymptomatic. She now reports that she does have occasional nausea in the morning. She underwent an ultrasound which showed gallstones with one gallstone in the gallbladder neck. There was no dilation of the bile duct. She reports no fatty food intolerance and no right quadrant abdominal pain. Her mother and her brother have had cholecystectomies for symptomatic gallstones. She is otherwise without complaints.   Past Surgical History  Breast Biopsy  Left. Oral Surgery  Thyroid Surgery  Tonsillectomy   Diagnostic Studies History Colonoscopy  never Mammogram  >3 years ago Pap Smear  >5 years ago  Allergies  No Known Drug Allergies 01/24/2017  Medication History Lisinopril (30MG  Tablet, Oral) Active. Armour Thyroid (30MG  Tablet, Oral) Active. Vitamin D (Cholecalciferol) (1000UNIT Capsule, Oral) Active. Medications Reconciled  Social History  Alcohol use  Occasional alcohol use. Caffeine use  Coffee, Tea. No drug use  Tobacco use  Former smoker.  Family History  Arthritis  Mother. Cancer  Brother. Depression  Daughter. Diabetes Mellitus  Mother. Hypertension  Daughter, Mother. Migraine Headache  Daughter. Prostate Cancer  Father. Thyroid problems  Mother.  Pregnancy / Birth History Age at menarche  29 years. Age of menopause  55-55 Gravida  3 Length (months) of breastfeeding  7-12 Maternal age  74-25 Para  2  Other  Problems Arthritis  Back Pain  Cancer  Cholelithiasis  High blood pressure  Thyroid Disease     Review of Systems  General Not Present- Appetite Loss, Chills, Fatigue, Fever, Night Sweats, Weight Gain and Weight Loss. Skin Not Present- Change in Wart/Mole, Dryness, Hives, Jaundice, New Lesions, Non-Healing Wounds, Rash and Ulcer. HEENT Present- Seasonal Allergies. Not Present- Earache, Hearing Loss, Hoarseness, Nose Bleed, Oral Ulcers, Ringing in the Ears, Sinus Pain, Sore Throat, Visual Disturbances, Wears glasses/contact lenses and Yellow Eyes. Respiratory Not Present- Bloody sputum, Chronic Cough, Difficulty Breathing, Snoring and Wheezing. Breast Not Present- Breast Mass, Breast Pain, Nipple Discharge and Skin Changes. Cardiovascular Present- Leg Cramps. Not Present- Chest Pain, Difficulty Breathing Lying Down, Palpitations, Rapid Heart Rate, Shortness of Breath and Swelling of Extremities. Gastrointestinal Not Present- Abdominal Pain, Bloating, Bloody Stool, Change in Bowel Habits, Chronic diarrhea, Constipation, Difficulty Swallowing, Excessive gas, Gets full quickly at meals, Hemorrhoids, Indigestion, Nausea, Rectal Pain and Vomiting. Female Genitourinary Present- Frequency. Not Present- Nocturia, Painful Urination, Pelvic Pain and Urgency. Musculoskeletal Present- Back Pain and Joint Pain. Not Present- Joint Stiffness, Muscle Pain, Muscle Weakness and Swelling of Extremities. Neurological Present- Tingling and Trouble walking. Not Present- Decreased Memory, Fainting, Headaches, Numbness, Seizures, Tremor and Weakness. Psychiatric Present- Fearful. Not Present- Anxiety, Bipolar, Change in Sleep Pattern, Depression and Frequent crying. Endocrine Present- Heat Intolerance. Not Present- Cold Intolerance, Excessive Hunger, Hair Changes, Hot flashes and New Diabetes. Hematology Present- Gland problems. Not Present- Blood Thinners, Easy Bruising, Excessive bleeding, HIV and Persistent  Infections.  Vitals   Weight: 197.6 lb Height: 62in Body Surface Area: 1.9 m Body Mass Index: 36.14 kg/m  Temp.: 98.59F(Oral)  BP: 142/92 (Sitting, Left Arm, Standard)    Physical Exam  General Mental Status-Alert. General Appearance-Consistent with stated age. Hydration-Well hydrated. Voice-Normal.  Head and Neck Head-normocephalic, atraumatic with no lesions or palpable masses.  Eye Eyeball - Bilateral-Extraocular movements intact. Sclera/Conjunctiva - Bilateral-No scleral icterus.  Chest and Lung Exam Chest and lung exam reveals -quiet, even and easy respiratory effort with no use of accessory muscles and on auscultation, normal breath sounds, no adventitious sounds and normal vocal resonance. Inspection Chest Wall - Normal. Back - normal.  Cardiovascular Cardiovascular examination reveals -on palpation PMI is normal in location and amplitude, no palpable S3 or S4. Normal cardiac borders., normal heart sounds, regular rate and rhythm with no murmurs, carotid auscultation reveals no bruits and normal pedal pulses bilaterally.  Abdomen Inspection Inspection of the abdomen reveals - No Hernias. Skin - Scar - no surgical scars. Palpation/Percussion Palpation and Percussion of the abdomen reveal - Soft, Non Tender, No Rebound tenderness, No Rigidity (guarding) and No hepatosplenomegaly. Auscultation Auscultation of the abdomen reveals - Bowel sounds normal.  Neurologic - Did not examine.  Musculoskeletal Normal Exam - Left-Upper Extremity Strength Normal and Lower Extremity Strength Normal. Normal Exam - Right-Upper Extremity Strength Normal, Lower Extremity Weakness.    Assessment & Plan    SYMPTOMATIC CHOLELITHIASIS (K80.20)  Impression: I discussed the ultrasound findings with her as well as her liver function tests. It is concerning that she does have nausea now and that there is a stone in the gallbladder neck. I recommend a  laparoscopic cholecystectomy with intraoperative cholangiogram. I'm suspicious that this is a cause of her liver function tests. Also, given the fact the stone is in the gallbladder neck, I think she is high risk for developing cholecystitis. I discussed the surgical procedure with her and her daughter in detail. I gave him literature regarding the surgery. I discussed the risk of surgery which includes but is not limited to bleeding, infection, bile duct injury, bile leak, injury to other structures, the need to convert to an open procedure, the chest despite all resolve all her symptoms, cardiopulmonary issues, postoperative recovery, etc. She understands and wishes to proceed with surgery which will be scheduled

## 2017-03-09 ENCOUNTER — Ambulatory Visit (HOSPITAL_BASED_OUTPATIENT_CLINIC_OR_DEPARTMENT_OTHER): Payer: Medicare Other | Admitting: Anesthesiology

## 2017-03-09 ENCOUNTER — Ambulatory Visit (HOSPITAL_COMMUNITY): Payer: Medicare Other

## 2017-03-09 ENCOUNTER — Ambulatory Visit (HOSPITAL_BASED_OUTPATIENT_CLINIC_OR_DEPARTMENT_OTHER)
Admission: RE | Admit: 2017-03-09 | Discharge: 2017-03-09 | Disposition: A | Discharge status: Home / Self Care | Payer: Medicare Other | Source: Ambulatory Visit | Attending: Surgery | Admitting: Surgery

## 2017-03-09 ENCOUNTER — Encounter (HOSPITAL_BASED_OUTPATIENT_CLINIC_OR_DEPARTMENT_OTHER)
Admission: RE | Disposition: A | Discharge status: Home / Self Care | Payer: Self-pay | Source: Ambulatory Visit | Attending: Surgery

## 2017-03-09 ENCOUNTER — Encounter (HOSPITAL_BASED_OUTPATIENT_CLINIC_OR_DEPARTMENT_OTHER): Payer: Self-pay | Admitting: Anesthesiology

## 2017-03-09 DIAGNOSIS — Z6836 Body mass index (BMI) 36.0-36.9, adult: Secondary | ICD-10-CM | POA: Diagnosis not present

## 2017-03-09 DIAGNOSIS — Z79899 Other long term (current) drug therapy: Secondary | ICD-10-CM | POA: Insufficient documentation

## 2017-03-09 DIAGNOSIS — I10 Essential (primary) hypertension: Secondary | ICD-10-CM | POA: Diagnosis not present

## 2017-03-09 DIAGNOSIS — E669 Obesity, unspecified: Secondary | ICD-10-CM | POA: Diagnosis not present

## 2017-03-09 DIAGNOSIS — K8020 Calculus of gallbladder without cholecystitis without obstruction: Secondary | ICD-10-CM

## 2017-03-09 DIAGNOSIS — Z87891 Personal history of nicotine dependence: Secondary | ICD-10-CM | POA: Diagnosis not present

## 2017-03-09 DIAGNOSIS — M1990 Unspecified osteoarthritis, unspecified site: Secondary | ICD-10-CM | POA: Insufficient documentation

## 2017-03-09 DIAGNOSIS — K8010 Calculus of gallbladder with chronic cholecystitis without obstruction: Secondary | ICD-10-CM | POA: Diagnosis not present

## 2017-03-09 HISTORY — PX: CHOLECYSTECTOMY: SHX55

## 2017-03-09 HISTORY — DX: Sleep apnea, unspecified: G47.30

## 2017-03-09 HISTORY — DX: Other specified postprocedural states: Z98.890

## 2017-03-09 HISTORY — DX: Hypothyroidism, unspecified: E03.9

## 2017-03-09 HISTORY — DX: Other specified postprocedural states: R11.2

## 2017-03-09 HISTORY — DX: Calculus of gallbladder without cholecystitis without obstruction: K80.20

## 2017-03-09 HISTORY — DX: Unspecified osteoarthritis, unspecified site: M19.90

## 2017-03-09 HISTORY — DX: Essential (primary) hypertension: I10

## 2017-03-09 HISTORY — DX: Prediabetes: R73.03

## 2017-03-09 SURGERY — LAPAROSCOPIC CHOLECYSTECTOMY WITH INTRAOPERATIVE CHOLANGIOGRAM
Anesthesia: General | Site: Abdomen

## 2017-03-09 MED ORDER — LIDOCAINE 2% (20 MG/ML) 5 ML SYRINGE
INTRAMUSCULAR | Status: AC
  Filled 2017-03-09: qty 5

## 2017-03-09 MED ORDER — ACETAMINOPHEN 650 MG RE SUPP: 650.0000 mg | RECTAL | Status: DC | PRN

## 2017-03-09 MED ORDER — HYDROCODONE-ACETAMINOPHEN 5-325 MG PO TABS: 1.0000 | ORAL_TABLET | ORAL | 0 refills | Status: DC | PRN

## 2017-03-09 MED ORDER — LIDOCAINE 2% (20 MG/ML) 5 ML SYRINGE
INTRAMUSCULAR | Status: DC | PRN
Start: 2017-03-09 — End: 2017-03-09
  Administered 2017-03-09: 80 mg via INTRAVENOUS

## 2017-03-09 MED ORDER — SODIUM CHLORIDE 0.9% FLUSH: 3.0000 mL | INTRAVENOUS | Status: DC | PRN

## 2017-03-09 MED ORDER — SUGAMMADEX SODIUM 200 MG/2ML IV SOLN
INTRAVENOUS | Status: AC
  Filled 2017-03-09: qty 2

## 2017-03-09 MED ORDER — ROCURONIUM BROMIDE 10 MG/ML (PF) SYRINGE
PREFILLED_SYRINGE | INTRAVENOUS | Status: AC
  Filled 2017-03-09: qty 5

## 2017-03-09 MED ORDER — SCOPOLAMINE 1 MG/3DAYS TD PT72: 1.0000 | MEDICATED_PATCH | Freq: Once | TRANSDERMAL | Status: DC | PRN

## 2017-03-09 MED ORDER — PROPOFOL 10 MG/ML IV BOLUS
INTRAVENOUS | Status: DC | PRN
  Administered 2017-03-09: 150 mg via INTRAVENOUS

## 2017-03-09 MED ORDER — ONDANSETRON HCL 4 MG/2ML IJ SOLN
INTRAMUSCULAR | Status: AC
  Filled 2017-03-09: qty 2

## 2017-03-09 MED ORDER — ONDANSETRON HCL 4 MG/2ML IJ SOLN: 4.0000 mg | Freq: Once | INTRAMUSCULAR | Status: DC | PRN

## 2017-03-09 MED ORDER — FENTANYL CITRATE (PF) 100 MCG/2ML IJ SOLN
INTRAMUSCULAR | Status: AC
  Filled 2017-03-09: qty 2

## 2017-03-09 MED ORDER — MIDAZOLAM HCL 2 MG/2ML IJ SOLN: 1.0000 mg | INTRAMUSCULAR | Status: DC | PRN

## 2017-03-09 MED ORDER — CEFAZOLIN SODIUM-DEXTROSE 2-4 GM/100ML-% IV SOLN
INTRAVENOUS | Status: AC
  Filled 2017-03-09: qty 100

## 2017-03-09 MED ORDER — OXYCODONE HCL 5 MG PO TABS: 5.0000 mg | ORAL_TABLET | ORAL | Status: DC | PRN

## 2017-03-09 MED ORDER — LACTATED RINGERS IV SOLN
INTRAVENOUS | Status: DC
  Administered 2017-03-09 (×3): via INTRAVENOUS

## 2017-03-09 MED ORDER — SODIUM CHLORIDE 0.9 % IV SOLN
INTRAVENOUS | Status: DC | PRN
  Administered 2017-03-09: 6 mL

## 2017-03-09 MED ORDER — MORPHINE SULFATE (PF) 2 MG/ML IV SOLN: 1.0000 mg | INTRAVENOUS | Status: DC | PRN

## 2017-03-09 MED ORDER — SUGAMMADEX SODIUM 200 MG/2ML IV SOLN
INTRAVENOUS | Status: DC | PRN
  Administered 2017-03-09: 200 mg via INTRAVENOUS

## 2017-03-09 MED ORDER — IOPAMIDOL (ISOVUE-300) INJECTION 61%
INTRAVENOUS | Status: AC
  Filled 2017-03-09: qty 50

## 2017-03-09 MED ORDER — OXYCODONE HCL 5 MG/5ML PO SOLN: 5.0000 mg | Freq: Once | ORAL | Status: AC | PRN

## 2017-03-09 MED ORDER — FENTANYL CITRATE (PF) 100 MCG/2ML IJ SOLN
50.0000 ug | INTRAMUSCULAR | Status: DC | PRN
  Administered 2017-03-09: 100 ug via INTRAVENOUS

## 2017-03-09 MED ORDER — SODIUM CHLORIDE 0.9% FLUSH: 3.0000 mL | Freq: Two times a day (BID) | INTRAVENOUS | Status: DC

## 2017-03-09 MED ORDER — ONDANSETRON HCL 4 MG/2ML IJ SOLN
INTRAMUSCULAR | Status: DC | PRN
  Administered 2017-03-09: 4 mg via INTRAVENOUS

## 2017-03-09 MED ORDER — OXYCODONE HCL 5 MG PO TABS
5.0000 mg | ORAL_TABLET | Freq: Once | ORAL | Status: AC | PRN
  Administered 2017-03-09: 5 mg via ORAL

## 2017-03-09 MED ORDER — OXYCODONE HCL 5 MG PO TABS
ORAL_TABLET | ORAL | Status: AC
  Filled 2017-03-09: qty 1

## 2017-03-09 MED ORDER — SODIUM CHLORIDE 0.9 % IV SOLN: 250.0000 mL | INTRAVENOUS | Status: DC | PRN

## 2017-03-09 MED ORDER — BUPIVACAINE-EPINEPHRINE (PF) 0.5% -1:200000 IJ SOLN
INTRAMUSCULAR | Status: DC | PRN
  Administered 2017-03-09: 20 mL

## 2017-03-09 MED ORDER — SODIUM CHLORIDE 0.9 % IR SOLN
Status: DC | PRN
  Administered 2017-03-09: 1000 mL

## 2017-03-09 MED ORDER — CHLORHEXIDINE GLUCONATE CLOTH 2 % EX PADS: 6.0000 | MEDICATED_PAD | Freq: Once | CUTANEOUS | Status: DC

## 2017-03-09 MED ORDER — CEFAZOLIN SODIUM-DEXTROSE 2-4 GM/100ML-% IV SOLN
2.0000 g | INTRAVENOUS | Status: AC
  Administered 2017-03-09: 2 g via INTRAVENOUS

## 2017-03-09 MED ORDER — ROCURONIUM BROMIDE 100 MG/10ML IV SOLN
INTRAVENOUS | Status: DC | PRN
  Administered 2017-03-09: 40 mg via INTRAVENOUS

## 2017-03-09 MED ORDER — PHENYLEPHRINE 40 MCG/ML (10ML) SYRINGE FOR IV PUSH (FOR BLOOD PRESSURE SUPPORT)
PREFILLED_SYRINGE | INTRAVENOUS | Status: DC | PRN
  Administered 2017-03-09 (×2): 80 ug via INTRAVENOUS

## 2017-03-09 MED ORDER — ACETAMINOPHEN 160 MG/5ML PO SOLN: 325.0000 mg | ORAL | Status: DC | PRN

## 2017-03-09 MED ORDER — DEXAMETHASONE SODIUM PHOSPHATE 10 MG/ML IJ SOLN
INTRAMUSCULAR | Status: AC
  Filled 2017-03-09: qty 1

## 2017-03-09 MED ORDER — ACETAMINOPHEN 325 MG PO TABS: 325.0000 mg | ORAL_TABLET | ORAL | Status: DC | PRN

## 2017-03-09 MED ORDER — DEXAMETHASONE SODIUM PHOSPHATE 4 MG/ML IJ SOLN
INTRAMUSCULAR | Status: DC | PRN
  Administered 2017-03-09: 10 mg via INTRAVENOUS

## 2017-03-09 MED ORDER — ACETAMINOPHEN 325 MG PO TABS: 650.0000 mg | ORAL_TABLET | ORAL | Status: DC | PRN

## 2017-03-09 MED ORDER — FENTANYL CITRATE (PF) 100 MCG/2ML IJ SOLN
25.0000 ug | INTRAMUSCULAR | Status: DC | PRN
  Administered 2017-03-09 (×3): 25 ug via INTRAVENOUS

## 2017-03-09 SURGICAL SUPPLY — 38 items
ADH SKN CLS APL DERMABOND .7 (GAUZE/BANDAGES/DRESSINGS) ×1
APPLIER CLIP 5 13 M/L LIGAMAX5 (MISCELLANEOUS) ×2
APR CLP MED LRG 5 ANG JAW (MISCELLANEOUS) ×1
BAG SPEC RTRVL LRG 6X4 10 (ENDOMECHANICALS) ×1
BLADE CLIPPER SURG (BLADE) IMPLANT
CHLORAPREP W/TINT 26ML (MISCELLANEOUS) ×2 IMPLANT
CLIP APPLIE 5 13 M/L LIGAMAX5 (MISCELLANEOUS) ×1 IMPLANT
COVER MAYO STAND STRL (DRAPES) IMPLANT
DECANTER SPIKE VIAL GLASS SM (MISCELLANEOUS) IMPLANT
DERMABOND ADVANCED (GAUZE/BANDAGES/DRESSINGS) ×1
DERMABOND ADVANCED .7 DNX12 (GAUZE/BANDAGES/DRESSINGS) ×1 IMPLANT
DRAPE C-ARM 42X72 X-RAY (DRAPES) IMPLANT
DRAPE LAPAROSCOPIC ABDOMINAL (DRAPES) ×2 IMPLANT
ELECT REM PT RETURN 9FT ADLT (ELECTROSURGICAL) ×2
ELECTRODE REM PT RTRN 9FT ADLT (ELECTROSURGICAL) ×1 IMPLANT
FILTER SMOKE EVAC LAPAROSHD (FILTER) IMPLANT
GLOVE SURG SIGNA 7.5 PF LTX (GLOVE) ×2 IMPLANT
GOWN STRL REUS W/ TWL LRG LVL3 (GOWN DISPOSABLE) ×2 IMPLANT
GOWN STRL REUS W/ TWL XL LVL3 (GOWN DISPOSABLE) ×1 IMPLANT
GOWN STRL REUS W/TWL LRG LVL3 (GOWN DISPOSABLE) ×4
GOWN STRL REUS W/TWL XL LVL3 (GOWN DISPOSABLE) ×2
HEMOSTAT SNOW SURGICEL 2X4 (HEMOSTASIS) ×2 IMPLANT
PACK BASIN DAY SURGERY FS (CUSTOM PROCEDURE TRAY) ×2 IMPLANT
POUCH SPECIMEN RETRIEVAL 10MM (ENDOMECHANICALS) ×2 IMPLANT
SCISSORS LAP 5X35 DISP (ENDOMECHANICALS) IMPLANT
SET CHOLANGIOGRAPH 5 50 .035 (SET/KITS/TRAYS/PACK) ×1 IMPLANT
SET IRRIG TUBING LAPAROSCOPIC (IRRIGATION / IRRIGATOR) ×2 IMPLANT
SLEEVE ENDOPATH XCEL 5M (ENDOMECHANICALS) ×4 IMPLANT
SLEEVE SCD COMPRESS KNEE MED (MISCELLANEOUS) ×2 IMPLANT
SPECIMEN JAR SMALL (MISCELLANEOUS) ×2 IMPLANT
SUT MON AB 4-0 PC3 18 (SUTURE) ×2 IMPLANT
SUT VICRYL 0 UR6 27IN ABS (SUTURE) IMPLANT
TOWEL OR 17X24 6PK STRL BLUE (TOWEL DISPOSABLE) ×2 IMPLANT
TRAY LAPAROSCOPIC (CUSTOM PROCEDURE TRAY) ×2 IMPLANT
TROCAR XCEL BLUNT TIP 100MML (ENDOMECHANICALS) ×2 IMPLANT
TROCAR XCEL NON-BLD 5MMX100MML (ENDOMECHANICALS) ×2 IMPLANT
TUBE CONNECTING 20X1/4 (TUBING) ×2 IMPLANT
TUBING INSUFFLATION (TUBING) ×2 IMPLANT

## 2017-03-09 NOTE — Op Note (Signed)
Laparoscopic Cholecystectomy with IOC Procedure Note  Indications: This patient presents with symptomatic gallbladder disease and will undergo laparoscopic cholecystectomy.  Pre-operative Diagnosis: symptomatic cholelithiasis  Post-operative Diagnosis: Same  Surgeon: Coralie Keens A   Assistants: 0  Anesthesia: General endotracheal anesthesia  ASA Class: 2  Procedure Details  The patient was seen again in the Holding Room. The risks, benefits, complications, treatment options, and expected outcomes were discussed with the patient. The possibilities of reaction to medication, pulmonary aspiration, perforation of viscus, bleeding, recurrent infection, finding a normal gallbladder, the need for additional procedures, failure to diagnose a condition, the possible need to convert to an open procedure, and creating a complication requiring transfusion or operation were discussed with the patient. The likelihood of improving the patient's symptoms with return to their baseline status is good.  The patient and/or family concurred with the proposed plan, giving informed consent. The site of surgery properly noted. The patient was taken to Operating Room, identified as Magon Croson and the procedure verified as Laparoscopic Cholecystectomy with Intraoperative Cholangiogram. A Time Out was held and the above information confirmed.  Prior to the induction of general anesthesia, antibiotic prophylaxis was administered. General endotracheal anesthesia was then administered and tolerated well. After the induction, the abdomen was prepped with Chloraprep and draped in the sterile fashion. The patient was positioned in the supine position.  Local anesthetic agent was injected into the skin near the umbilicus and an incision made. We dissected down to the abdominal fascia with blunt dissection.  The fascia was incised vertically and we entered the peritoneal cavity bluntly.  A pursestring suture of 0-Vicryl  was placed around the fascial opening.  The Hasson cannula was inserted and secured with the stay suture.  Pneumoperitoneum was then created with CO2 and tolerated well without any adverse changes in the patient's vital signs. A 5-mm port was placed in the subxiphoid position.  Two 5-mm ports were placed in the right upper quadrant. All skin incisions were infiltrated with a local anesthetic agent before making the incision and placing the trocars.   We positioned the patient in reverse Trendelenburg, tilted slightly to the patient's left.  The gallbladder was identified, the fundus grasped and retracted cephalad. Adhesions were lysed bluntly and with the electrocautery where indicated, taking care not to injure any adjacent organs or viscus. The infundibulum was grasped and retracted laterally, exposing the peritoneum overlying the triangle of Calot. This was then divided and exposed in a blunt fashion. A critical view of the cystic duct and cystic artery was obtained.  The cystic duct was clearly identified and bluntly dissected circumferentially. The cystic duct was ligated with a clip distally.   An incision was made in the cystic duct and the [facility] cholangiogram catheter introduced. The catheter was secured using a clip. A cholangiogram was then obtained which showed good visualization of the distal and proximal biliary tree with no sign of filling defects or obstruction.  Contrast flowed easily into the duodenum. The catheter was then removed.   The cystic duct was then ligated with clips and divided. The cystic artery was identified, dissected free, ligated with clips and divided as well.   The gallbladder was dissected from the liver bed in retrograde fashion with the electrocautery. The gallbladder was removed and placed in an Endocatch sac. The liver bed was irrigated and inspected. Hemostasis was achieved with the electrocautery. Copious irrigation was utilized and was repeatedly aspirated until  clear.  The gallbladder and Endocatch sac were then removed  through the umbilical port site.  The pursestring suture was used to close the umbilical fascia.    We again inspected the right upper quadrant for hemostasis.  Pneumoperitoneum was released as we removed the trocars.  4-0 Monocryl was used to close the skin.   Skin glue was then applied. The patient was then extubated and brought to the recovery room in stable condition. Instrument, sponge, and needle counts were correct at closure and at the conclusion of the case.   Findings: Chronic Cholecystitis with Cholelithiasis  Estimated Blood Loss: Minimal         Drains: 0         Specimens: Gallbladder           Complications: None; patient tolerated the procedure well.         Disposition: PACU - hemodynamically stable.         Condition: stable

## 2017-03-09 NOTE — Anesthesia Procedure Notes (Signed)
Procedure Name: Intubation Date/Time: 03/09/2017 1:16 PM Performed by: Lyndee Leo Pre-anesthesia Checklist: Patient identified, Emergency Drugs available, Suction available and Patient being monitored Patient Re-evaluated:Patient Re-evaluated prior to induction Oxygen Delivery Method: Circle system utilized Preoxygenation: Pre-oxygenation with 100% oxygen Induction Type: IV induction Ventilation: Mask ventilation without difficulty and Oral airway inserted - appropriate to patient size Laryngoscope Size: Mac and 3 Grade View: Grade II Tube type: Oral Tube size: 7.0 mm Number of attempts: 1 Airway Equipment and Method: Stylet and Oral airway Placement Confirmation: ETT inserted through vocal cords under direct vision,  positive ETCO2 and breath sounds checked- equal and bilateral Secured at: 20 cm Tube secured with: Tape Dental Injury: Teeth and Oropharynx as per pre-operative assessment

## 2017-03-09 NOTE — Discharge Instructions (Signed)
[facility] ______[facility] LAPAROSCOPIC SURGERY: POST OP INSTRUCTIONS Always review your discharge instruction sheet given to you by the facility where your surgery was performed. IF YOU HAVE DISABILITY OR FAMILY LEAVE FORMS, YOU MUST BRING THEM TO THE OFFICE FOR PROCESSING.   DO NOT GIVE THEM TO YOUR DOCTOR.  1. A prescription for pain medication may be given to you upon discharge.  Take your pain medication as prescribed, if needed.  If narcotic pain medicine is not needed, then you may take acetaminophen (Tylenol) or ibuprofen (Advil) as needed. 2. Take your usually prescribed medications unless otherwise directed. 3. If you need a refill on your pain medication, please contact your pharmacy.  They will contact our office to request authorization. Prescriptions will not be filled after 5pm or on week-ends. 4. You should follow a light diet the first few days after arrival home, such as soup and crackers, etc.  Be sure to include lots of fluids daily. 5. Most patients will experience some swelling and bruising in the area of the incisions.  Ice packs will help.  Swelling and bruising can take several days to resolve.  6. It is common to experience some constipation if taking pain medication after surgery.  Increasing fluid intake and taking a stool softener (such as Colace) will usually help or prevent this problem from occurring.  A mild laxative (Milk of Magnesia or Miralax) should be taken according to package instructions if there are no bowel movements after 48 hours. 7. Unless discharge instructions indicate otherwise, you may remove your bandages 24-48 hours after surgery, and you may shower at that time.  You may have steri-strips (small skin tapes) in place directly over the incision.  These strips should be left on the skin for 7-10 days.  If your surgeon used skin glue on the incision, you may shower in 24 hours.  The glue will flake off over the next 2-3 weeks.  Any sutures or  staples will be removed at the office during your follow-up visit. 8. ACTIVITIES:  You may resume regular (light) daily activities beginning the next day--such as daily self-care, walking, climbing stairs--gradually increasing activities as tolerated.  You may have sexual intercourse when it is comfortable.  Refrain from any heavy lifting or straining until approved by your doctor. a. You may drive when you are no longer taking prescription pain medication, you can comfortably wear a seatbelt, and you can safely maneuver your car and apply brakes. b. RETURN TO WORK:  __________________________________________________________ 9. You should see your doctor in the office for a follow-up appointment approximately 2-3 weeks after your surgery.  Make sure that you call for this appointment within a day or two after you arrive home to insure a convenient appointment time. 10. OTHER INSTRUCTIONS:OK TO SHOWER TOMORROW 11. ICE PACK, IBUPROFEN, TYLENOL ALSO FOR PIAN 12. NO LIFTING MORE THAN 15 POUNDS FOR 2 WEEKS __________________________________________________________________________________________________________________________ __________________________________________________________________________________________________________________________ WHEN TO CALL YOUR DOCTOR: 1. Fever over 101.0 2. Inability to urinate 3. Continued bleeding from incision. 4. Increased pain, redness, or drainage from the incision. 5. Increasing abdominal pain  The clinic staff is available to answer your questions during regular business hours.  Please dont hesitate to call and ask to speak to one of the nurses for clinical concerns.  If you have a medical emergency, go to the nearest emergency room or call 911.  A surgeon from [facility] is always on call at the hospital. [address] [place], [place], [place]  [address] ? [address], [place], [place]   [id number] [phone] ? [phone] ? FAX  [phone] Web site: [email or web address]   Post Anesthesia Home Care Instructions  Activity: Get plenty of rest for the remainder of the day. A responsible individual must stay with you for 24 hours following the procedure.  For the next 24 hours, DO NOT: -Drive a car -Paediatric nurse -Drink alcoholic beverages -Take any medication unless instructed by your physician -Make any legal decisions or sign important papers.  Meals: Start with liquid foods such as gelatin or soup. Progress to regular foods as tolerated. Avoid greasy, spicy, heavy foods. If nausea and/or vomiting occur, drink only clear liquids until the nausea and/or vomiting subsides. Call your physician if vomiting continues.  Special Instructions/Symptoms: Your throat may feel dry or sore from the anesthesia or the breathing tube placed in your throat during surgery. If this causes discomfort, gargle with warm salt water. The discomfort should disappear within 24 hours.  If you had a scopolamine patch placed behind your ear for the management of post- operative nausea and/or vomiting:  1. The medication in the patch is effective for 72 hours, after which it should be removed.  Wrap patch in a tissue and discard in the trash. Wash hands thoroughly with soap and water. 2. You may remove the patch earlier than 72 hours if you experience unpleasant side effects which may include dry mouth, dizziness or visual disturbances. 3. Avoid touching the patch. Wash your hands with soap and water after contact with the patch.

## 2017-03-09 NOTE — Interval H&P Note (Signed)
History and Physical Interval Note: no change in H and P  03/09/2017 12:45 PM  Mercedes Khan  has presented today for surgery, with the diagnosis of symptomatic gallstones  The various methods of treatment have been discussed with the patient and family. After consideration of risks, benefits and other options for treatment, the patient has consented to  Procedure(s): LAPAROSCOPIC CHOLECYSTECTOMY WITH INTRAOPERATIVE CHOLANGIOGRAM (N/A) as a surgical intervention .  The patient's history has been reviewed, patient examined, no change in status, stable for surgery.  I have reviewed the patient's chart and labs.  Questions were answered to the patient's satisfaction.     Graceann Boileau A

## 2017-03-09 NOTE — Anesthesia Preprocedure Evaluation (Addendum)
Anesthesia Evaluation  Patient identified by MRN, date of birth, ID band Patient awake    Reviewed: Allergy & Precautions, NPO status , Patient's Chart, lab work & pertinent test results  Airway Mallampati: II       Dental no notable dental hx. (+) Teeth Intact   Pulmonary former smoker,    Pulmonary exam normal breath sounds clear to auscultation       Cardiovascular hypertension, Pt. on medications Normal cardiovascular exam Rhythm:Regular Rate:Normal     Neuro/Psych negative psych ROS   GI/Hepatic negative GI ROS, Neg liver ROS,   Endo/Other    Renal/GU negative Renal ROS  negative genitourinary   Musculoskeletal   Abdominal (+) + obese,   Peds  Hematology negative hematology ROS (+)   Anesthesia Other Findings   Reproductive/Obstetrics                            Anesthesia Physical Anesthesia Plan  ASA: II  Anesthesia Plan: General   Post-op Pain Management:    Induction: Intravenous  PONV Risk Score and Plan: 4 or greater and Ondansetron, Dexamethasone, Midazolam, Scopolamine patch - Pre-op and Propofol infusion  Airway Management Planned: Oral ETT  Additional Equipment:   Intra-op Plan:   Post-operative Plan: Extubation in OR  Informed Consent: I have reviewed the patients History and Physical, chart, labs and discussed the procedure including the risks, benefits and alternatives for the proposed anesthesia with the patient or authorized representative who has indicated his/her understanding and acceptance.   Dental advisory given  Plan Discussed with: CRNA and Surgeon  Anesthesia Plan Comments:         Anesthesia Quick Evaluation

## 2017-03-09 NOTE — Anesthesia Postprocedure Evaluation (Signed)
Anesthesia Post Note  Patient: Cristine Daw  Procedure(s) Performed: Procedure(s) (LRB): LAPAROSCOPIC CHOLECYSTECTOMY WITH INTRAOPERATIVE CHOLANGIOGRAM (N/A)     Patient location during evaluation: PACU Anesthesia Type: General Level of consciousness: awake and sedated Pain management: pain level controlled Vital Signs Assessment: post-procedure vital signs reviewed and stable Respiratory status: spontaneous breathing Cardiovascular status: stable Postop Assessment: no signs of nausea or vomiting Anesthetic complications: no    Last Vitals:  Vitals:   03/09/17 1415 03/09/17 1430  BP: (!) 190/86 (!) 180/81  Pulse: 99 98  Resp: 20 (!) 22  Temp:    SpO2: 98% 95%    Last Pain:  Vitals:   03/09/17 1445  TempSrc:   PainSc: 4    Pain Goal:                 Talyn Eddie JR,JOHN Taariq Leitz

## 2017-03-09 NOTE — Transfer of Care (Signed)
Immediate Anesthesia Transfer of Care Note  Patient: Carolanne Mercier  Procedure(s) Performed: Procedure(s): LAPAROSCOPIC CHOLECYSTECTOMY WITH INTRAOPERATIVE CHOLANGIOGRAM (N/A)  Patient Location: PACU  Anesthesia Type:General  Level of Consciousness: awake, sedated and lethargic  Airway & Oxygen Therapy: Patient Spontanous Breathing and Patient connected to face mask oxygen  Post-op Assessment: Report given to RN and Post -op Vital signs reviewed and stable  Post vital signs: Reviewed and stable  Last Vitals:  Vitals:   03/09/17 1213 03/09/17 1411  BP: (!) 186/92 (!) 181/92  Pulse: 84 100  Resp: 18 (!) 38  Temp: 37.1 C   SpO2: 98% (!) 86%    Last Pain:  Vitals:   03/09/17 1213  TempSrc: Oral         Complications: No apparent anesthesia complications

## 2017-03-10 ENCOUNTER — Encounter (HOSPITAL_BASED_OUTPATIENT_CLINIC_OR_DEPARTMENT_OTHER): Payer: Self-pay | Admitting: Surgery

## 2017-03-14 ENCOUNTER — Telehealth: Payer: Self-pay | Admitting: Family Medicine

## 2017-03-14 NOTE — Telephone Encounter (Signed)
Pt called in saying that she is recovering from surgery at the moment & she is unable to come back in at the moment for her UTI but she states she has not gotten better & wants to know if the doctor can call in some more of the antibiotic that was prescribed at he last visit, the medication is lisinopril   Please advise

## 2017-03-15 MED ORDER — CIPROFLOXACIN HCL 250 MG PO TABS: 250.0000 mg | ORAL_TABLET | Freq: Two times a day (BID) | ORAL | 0 refills | Status: DC

## 2017-03-15 NOTE — Telephone Encounter (Signed)
I spoke with patient, she is s/p lap chole, does not think she had a foley during surgery. She is having 3 days of dysuria with chills, not really ambulating much yet, urine cx S to cipro, has had cipro in the past wo issues. Rx sent to pharmacy on chart.

## 2017-03-15 NOTE — Telephone Encounter (Signed)
I CALLED PT AGAIN TO ADVISE HER TO COME INTO OFFICE SHE STARTED CRYING STATING THAT SHE JUST HAD SURGERY CANT LEAVE OUT OF HOUSE PLEASE SEND PRESCRIPTION  TO [facility] ON [address]

## 2017-03-15 NOTE — Telephone Encounter (Signed)
PT CALLING ABOUT HER UTI ITS GOTTEN WORST SHE'S CRYING NEED MEDICINE

## 2017-03-15 NOTE — Telephone Encounter (Signed)
I see during her last visit, you mentioned Keflex antibiotic. I do not see medication on MAR.  Please advise.

## 2017-03-16 ENCOUNTER — Telehealth: Payer: Self-pay | Admitting: Family Medicine

## 2017-03-16 NOTE — Telephone Encounter (Signed)
Pt states she has some questions about the medication ciprofloxacin (CIPRO) and the side effects, she would like you to give her a call.  Contact number [phone]

## 2017-03-17 ENCOUNTER — Ambulatory Visit (INDEPENDENT_AMBULATORY_CARE_PROVIDER_SITE_OTHER): Payer: Medicare Other | Admitting: Family Medicine

## 2017-03-17 ENCOUNTER — Encounter: Payer: Self-pay | Admitting: Family Medicine

## 2017-03-17 VITALS — BP 150/84 | HR 81 | Temp 98.8°F | Resp 16 | Ht 61.0 in | Wt 195.4 lb

## 2017-03-17 DIAGNOSIS — N390 Urinary tract infection, site not specified: Secondary | ICD-10-CM

## 2017-03-17 DIAGNOSIS — I10 Essential (primary) hypertension: Secondary | ICD-10-CM

## 2017-03-17 LAB — POCT URINALYSIS DIP (MANUAL ENTRY)
Bilirubin, UA: NEGATIVE
Glucose, UA: NEGATIVE mg/dL
Ketones, POC UA: NEGATIVE mg/dL
Nitrite, UA: POSITIVE — AB
Protein Ur, POC: 100 mg/dL — AB
Spec Grav, UA: 1.02 (ref 1.010–1.025)
Urobilinogen, UA: 1 E.U./dL
pH, UA: 7.5 (ref 5.0–8.0)

## 2017-03-17 LAB — POC MICROSCOPIC URINALYSIS (UMFC): Mucus: ABSENT

## 2017-03-17 MED ORDER — CEPHALEXIN 500 MG PO CAPS: 500.0000 mg | ORAL_CAPSULE | Freq: Two times a day (BID) | ORAL | 0 refills | Status: AC

## 2017-03-17 NOTE — Progress Notes (Signed)
8/24/201811:58 AM  Mercedes Khan 26-Mar-1941, 76 y.o. female [id number]  Chief Complaint  Patient presents with  . Urinary Tract Infection    f/u, pain w/urination, pt stated she did not take med cipro "I threw it away"    HPI:   Patient is a 76 y.o. female who presents today for concerns of dysuria, chills for past several days. Did not take rx cipro given for concerns of potential side effects. Denies fevers, nausea, vomiting, flank pain.  Depression screen [facility] 2/9 03/17/2017 03/03/2017 02/09/2017  Decreased Interest 0 0 0  Down, Depressed, Hopeless 0 0 0  PHQ - 2 Score 0 0 0    No Known Allergies  Current Outpatient Prescriptions on File Prior to Visit  Medication Sig Dispense Refill  . Cholecalciferol (VITAMIN D PO) Take by mouth.    Marland Kitchen lisinopril (PRINIVIL,ZESTRIL) 30 MG tablet Take 30 mg by mouth daily.    Marland Kitchen thyroid (ARMOUR) 30 MG tablet Take 30 mg by mouth daily.    Marland Kitchen HYDROcodone-acetaminophen (NORCO/VICODIN) 5-325 MG tablet Take 1 tablet by mouth every 4 (four) hours as needed for moderate pain or severe pain. (Patient not taking: Reported on 03/17/2017) 25 tablet 0   No current facility-administered medications on file prior to visit.     Past Medical History:  Diagnosis Date  . Allergy   . Arthritis   . Cancer ([place])    skin  . Gallstones   . Hypertension   . Hypothyroidism   . PONV (postoperative nausea and vomiting)    only after tonsillectomy at age 70yrs  . Pre-diabetes    Controlled with diet and exercise, last A1c 10-2016 was 8.7  . Sleep apnea    use to wear a mask, lost 30 lbs    Past Surgical History:  Procedure Laterality Date  . BREAST SURGERY     cyst removed from left breast  . CHOLECYSTECTOMY N/A 03/09/2017   Procedure: LAPAROSCOPIC CHOLECYSTECTOMY WITH INTRAOPERATIVE CHOLANGIOGRAM;  Surgeon: Coralie Keens, MD;  Location: [facility];  Service: General;  Laterality: N/A;  . THYROIDECTOMY     On the right side  .  TONSILLECTOMY    . Wisdom teeth extracted      Social History  Substance Use Topics  . Smoking status: Former Smoker    Types: Cigarettes    Quit date: 07/25/1972  . Smokeless tobacco: Never Used     Comment: Quit smoking years ago  . Alcohol use Yes     Comment: rarely    No family history on file.  Review of Systems  Constitutional: Positive for chills and malaise/fatigue. Negative for fever.  Gastrointestinal: Negative for abdominal pain, nausea and vomiting.  Genitourinary: Positive for dysuria, frequency and urgency. Negative for flank pain and hematuria.     OBJECTIVE:  Blood pressure (!) 150/84, pulse 81, temperature 98.8 F (37.1 C), temperature source Oral, resp. rate 16, height 5\' 1"  (1.549 m), weight 195 lb 6.4 oz (88.6 kg), SpO2 95 %.  Physical Exam  Constitutional: She is oriented to person, place, and time and well-developed, well-nourished, and in no distress.  HENT:  Head: Normocephalic and atraumatic.  Mouth/Throat: Oropharynx is clear and moist. No oropharyngeal exudate.  Eyes: Pupils are equal, round, and reactive to light. EOM are normal. No scleral icterus.  Neck: Neck supple.  Cardiovascular: Normal rate, regular rhythm and normal heart sounds.  Exam reveals no gallop and no friction rub.   No murmur heard. Pulmonary/Chest: Effort normal and breath  sounds normal. She has no wheezes. She has no rales.  Abdominal: There is no CVA tenderness.  Musculoskeletal: She exhibits no edema.  Neurological: She is alert and oriented to person, place, and time. Gait normal.  Skin: Skin is warm and dry.    Results for orders placed or performed in visit on 03/17/17  POCT urinalysis dipstick  Result Value Ref Range   Color, UA yellow yellow   Clarity, UA clear clear   Glucose, UA negative negative mg/dL   Bilirubin, UA negative negative   Ketones, POC UA negative negative mg/dL   Spec Grav, UA 1.020 1.010 - 1.025   Blood, UA trace-lysed (A) negative   pH, UA  7.5 5.0 - 8.0   Protein Ur, POC =100 (A) negative mg/dL   Urobilinogen, UA 1.0 0.2 or 1.0 E.U./dL   Nitrite, UA Positive (A) Negative   Leukocytes, UA Small (1+) (A) Negative  POCT Microscopic Urinalysis ([facility])  Result Value Ref Range   WBC,UR,HPF,POC Too numerous to count  (A) None WBC/hpf   RBC,UR,HPF,POC None None RBC/hpf   Bacteria Moderate (A) None, Too numerous to count   Mucus Absent Absent   Epithelial Cells, UR Per Microscopy Moderate (A) None, Too numerous to count cells/hpf     ASSESSMENT and PLAN:  1. Urinary tract infection without hematuria, site unspecified UA +, rx keflex 500mg  PO BID x 7 days. RTC precautions given. - Urine Culture - POCT urinalysis dipstick - POCT Microscopic Urinalysis ([facility])  2. Hypertension Previous doing well on current medication. Discussed checking BP in 1- 2 weeks with nurse visit, if still elevated titrate medications as needed. Routine ER precautions given.       Rutherford Guys, MD Primary [facility] at [place], [place] [address] Ph.  [phone] Fax [phone]

## 2017-03-17 NOTE — Patient Instructions (Signed)
     IF you received an x-ray today, you will receive an invoice from [facility]. Please contact [facility] at [phone] with questions or concerns regarding your invoice.   IF you received labwork today, you will receive an invoice from [facility]. Please contact [facility] at [phone] with questions or concerns regarding your invoice.   Our billing staff will not be able to assist you with questions regarding bills from these companies.  You will be contacted with the lab results as soon as they are available. The fastest way to get your results is to activate your My Chart account. Instructions are located on the last page of this paperwork. If you have not heard from us regarding the results in 2 weeks, please contact this office.     

## 2017-03-19 LAB — URINE CULTURE

## 2017-03-24 NOTE — Telephone Encounter (Signed)
Tried to call patient, but there was no answer and her VM had not been set up yet.

## 2017-03-24 NOTE — Telephone Encounter (Signed)
Patient returned call and stated that she has not called [place]. I reminded her of the cipro and she still denied calling.  I told her I was sorry for bothering her.

## 2018-08-02 IMAGING — RF DG CHOLANGIOGRAM OPERATIVE
1 series · 6 of 6 positions shown · non-contrast
Comparison: Right upper quadrant abdominal ultrasound - 12/02/2016

CLINICAL DATA: Intraoperative cholangiogram during laparoscopic
cholecystectomy.

EXAM:
INTRAOPERATIVE CHOLANGIOGRAM
FLUOROSCOPY TIME:  6 seconds

[Series 1: run · 3 acquisitions, 6 frames shown]
[im 1/3]
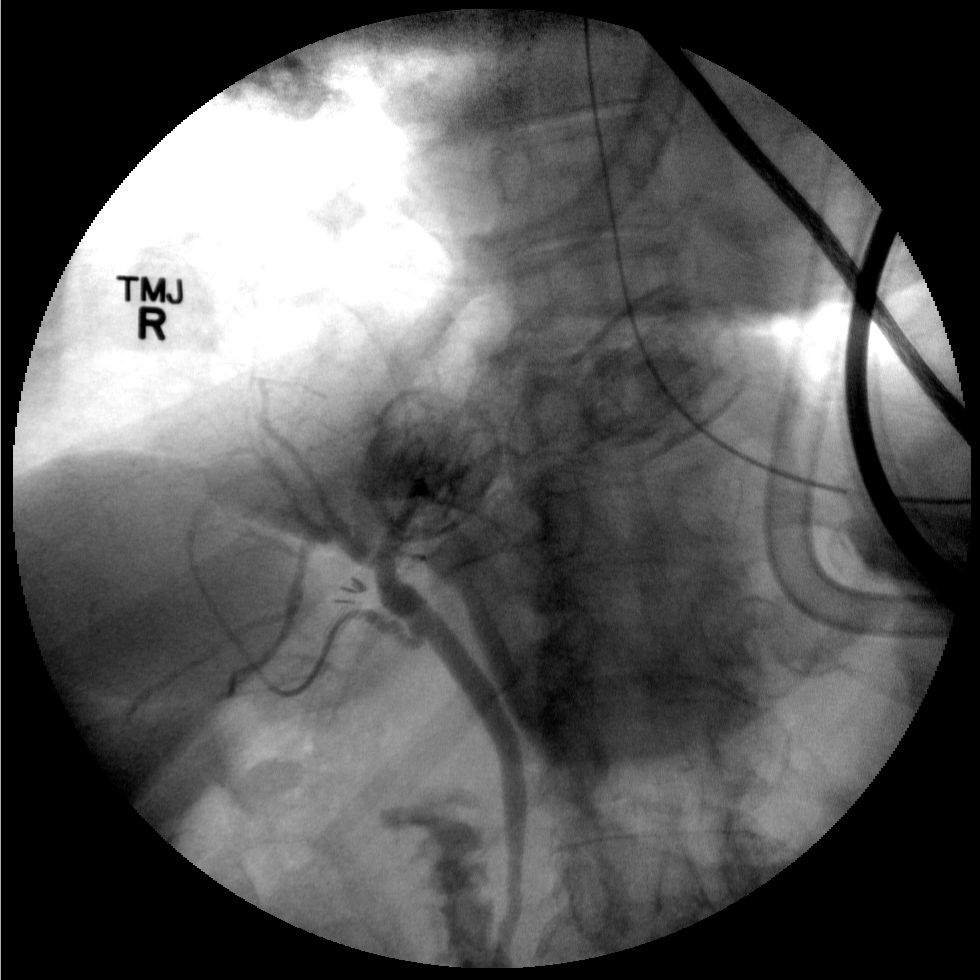
[im 1/3]
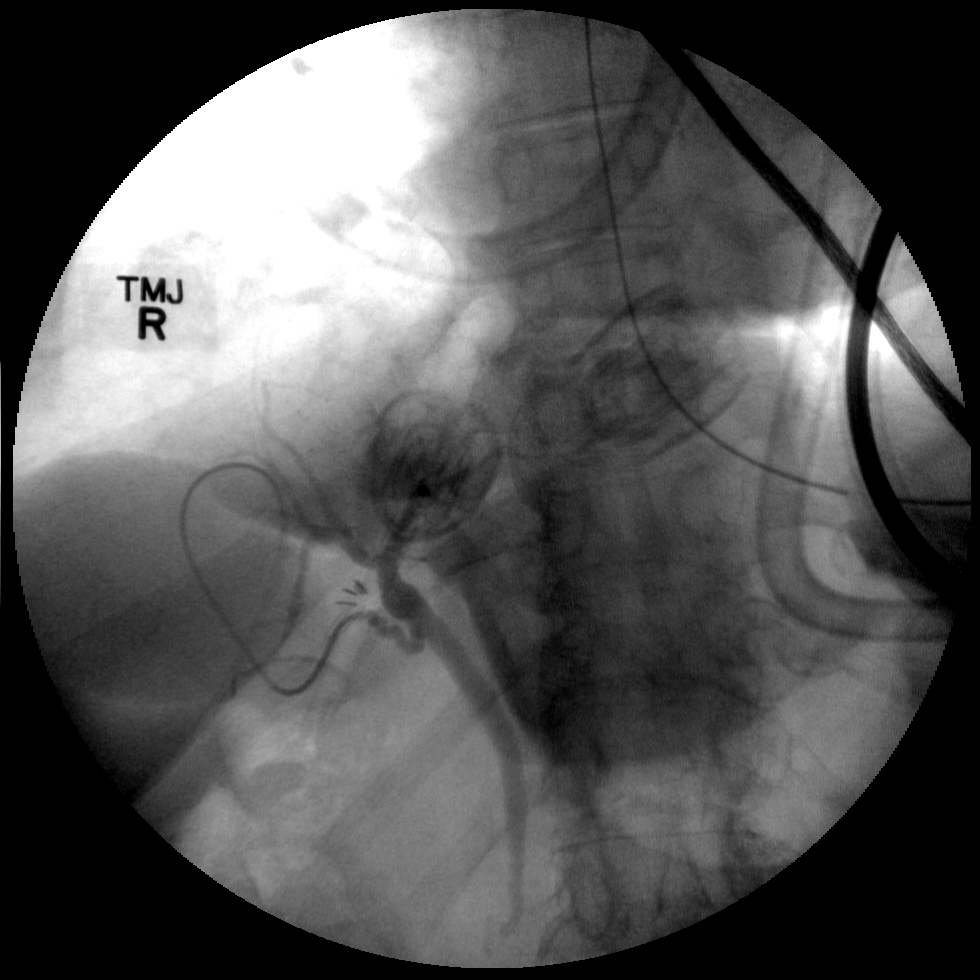
[im 1/3]
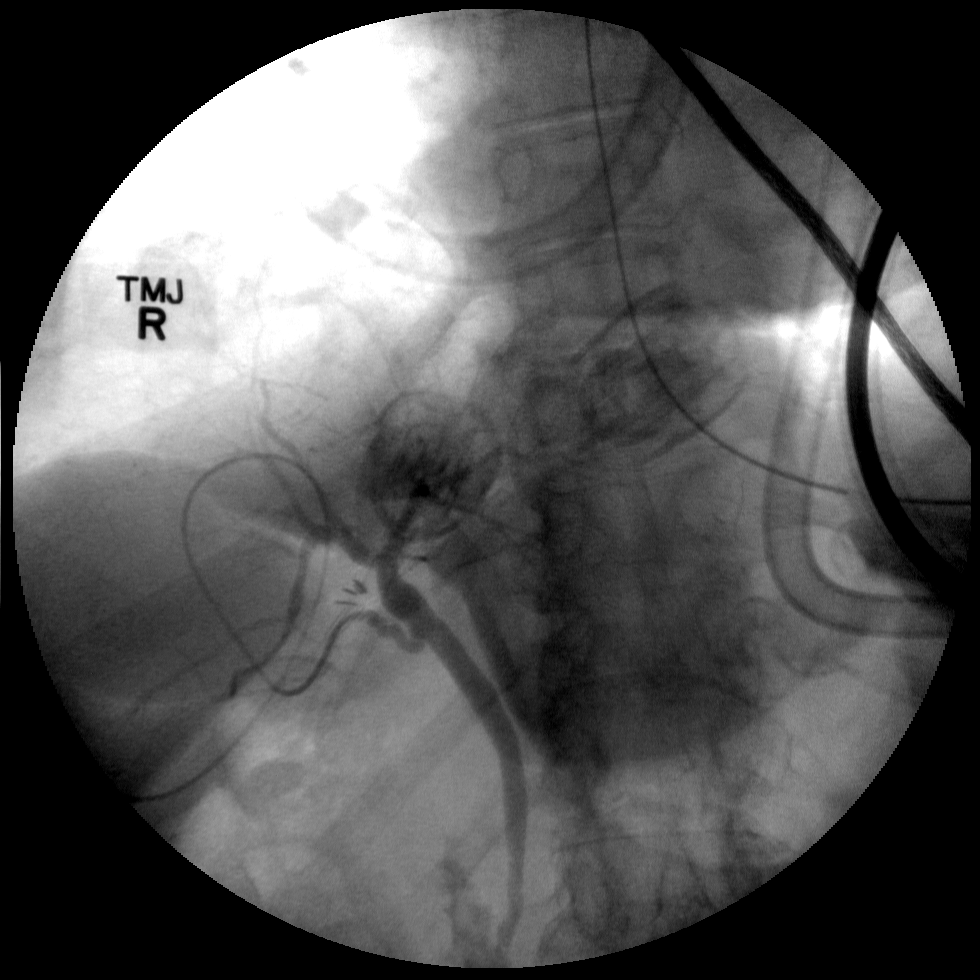
[im 1/3]
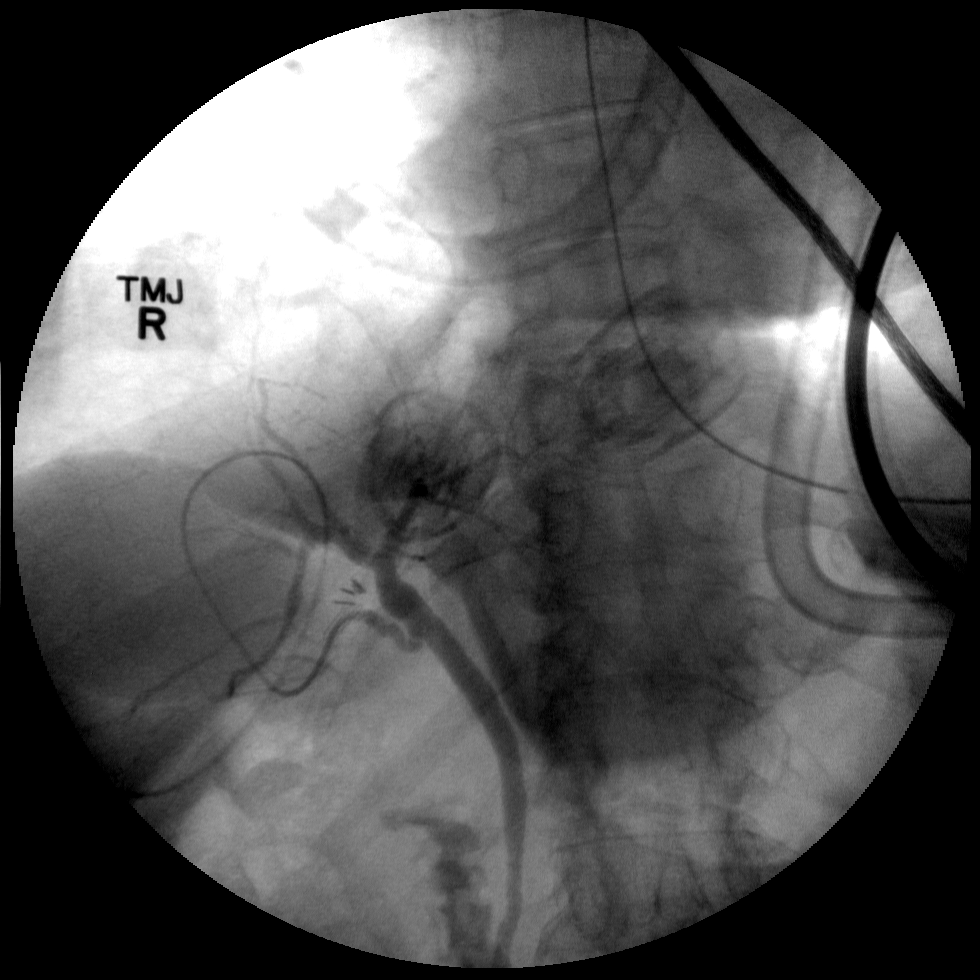
[im 2/3]
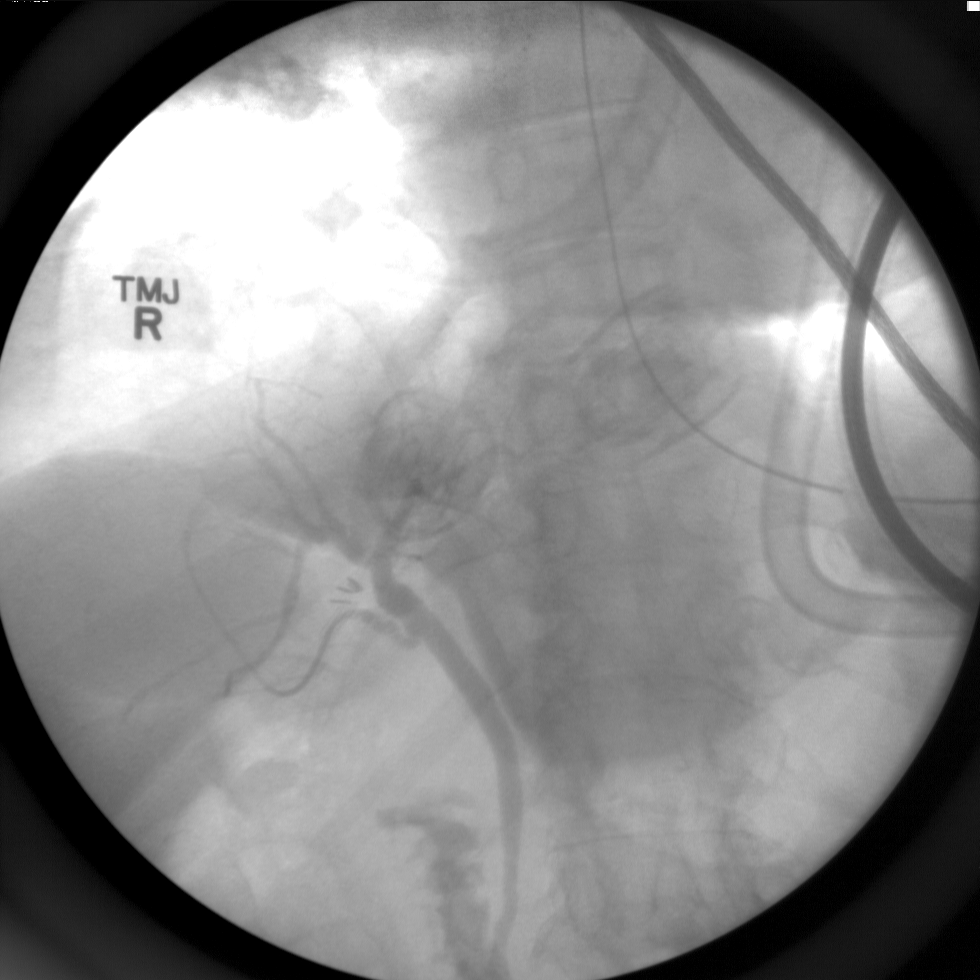
[im 3/3]
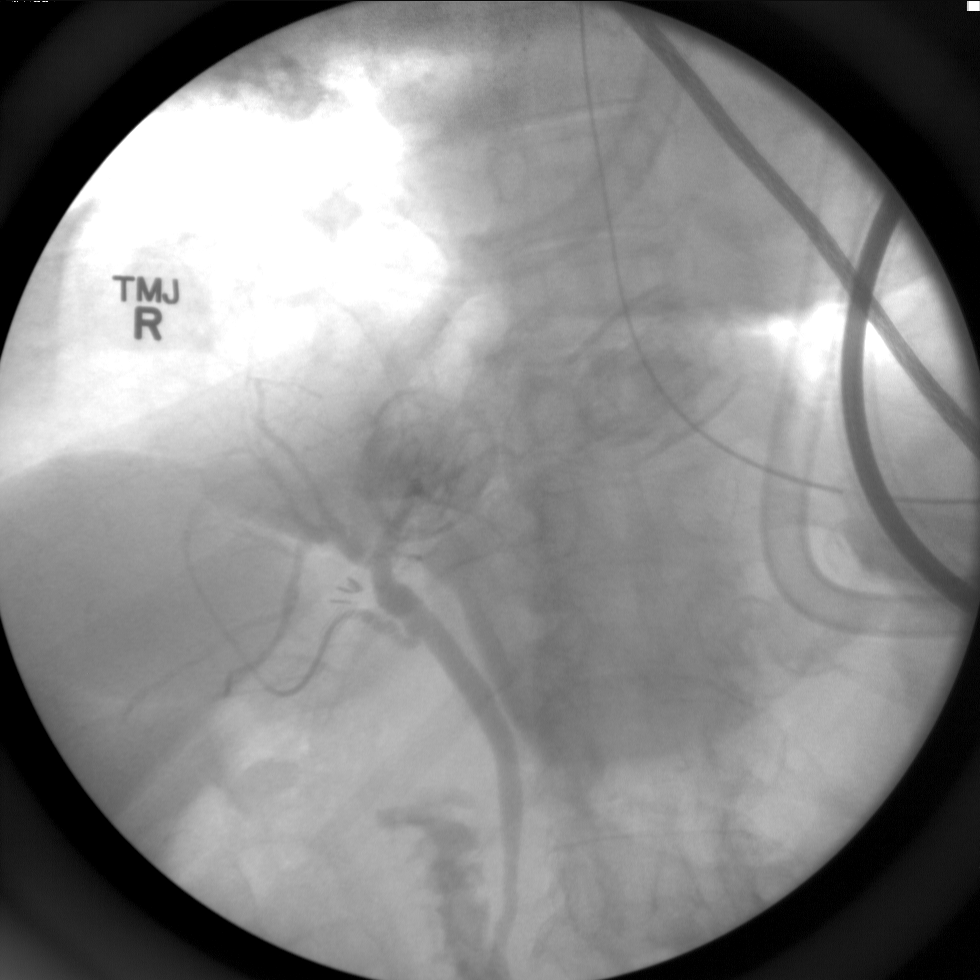

[6 of 6 positions shown; findings below may reference images not displayed]

FINDINGS: Intraoperative cholangiographic images of the right upper abdominal
quadrant during laparoscopic cholecystectomy are provided for
review.

Surgical clips overlie the expected location of the gallbladder
fossa.

Contrast injection demonstrates selective cannulation of the central
aspect of the cystic duct.

There is passage of contrast through the central aspect of the
cystic duct with filling of a non dilated common bile duct. There is
passage of contrast though the CBD and into the descending portion
of the duodenum.

There is minimal reflux of injected contrast into the common hepatic
duct and central aspect of the non dilated intrahepatic biliary
system.

There are no discrete filling defects within the opacified portions
of the biliary system to suggest the presence of
choledocholithiasis.
IMPRESSION: No evidence of choledocholithiasis.

## 2019-07-25 ENCOUNTER — Telehealth: Payer: Self-pay | Admitting: *Deleted

## 2019-07-25 NOTE — Telephone Encounter (Signed)
Schedule awv  

## 2021-11-25 DIAGNOSIS — M5459 Other low back pain: Secondary | ICD-10-CM | POA: Diagnosis not present

## 2021-11-25 DIAGNOSIS — M25551 Pain in right hip: Secondary | ICD-10-CM | POA: Diagnosis not present

## 2021-11-25 DIAGNOSIS — M25552 Pain in left hip: Secondary | ICD-10-CM | POA: Diagnosis not present

## 2021-11-25 DIAGNOSIS — R2681 Unsteadiness on feet: Secondary | ICD-10-CM | POA: Diagnosis not present

## 2021-12-16 DIAGNOSIS — R2681 Unsteadiness on feet: Secondary | ICD-10-CM | POA: Diagnosis not present

## 2021-12-16 DIAGNOSIS — M25552 Pain in left hip: Secondary | ICD-10-CM | POA: Diagnosis not present

## 2021-12-16 DIAGNOSIS — M25551 Pain in right hip: Secondary | ICD-10-CM | POA: Diagnosis not present

## 2021-12-16 DIAGNOSIS — M5459 Other low back pain: Secondary | ICD-10-CM | POA: Diagnosis not present

## 2021-12-31 DIAGNOSIS — I10 Essential (primary) hypertension: Secondary | ICD-10-CM | POA: Diagnosis not present

## 2021-12-31 DIAGNOSIS — R7989 Other specified abnormal findings of blood chemistry: Secondary | ICD-10-CM | POA: Diagnosis not present

## 2021-12-31 DIAGNOSIS — E039 Hypothyroidism, unspecified: Secondary | ICD-10-CM | POA: Diagnosis not present

## 2021-12-31 DIAGNOSIS — E1169 Type 2 diabetes mellitus with other specified complication: Secondary | ICD-10-CM | POA: Diagnosis not present

## 2022-01-06 DIAGNOSIS — M25551 Pain in right hip: Secondary | ICD-10-CM | POA: Diagnosis not present

## 2022-01-06 DIAGNOSIS — R2681 Unsteadiness on feet: Secondary | ICD-10-CM | POA: Diagnosis not present

## 2022-01-06 DIAGNOSIS — M25552 Pain in left hip: Secondary | ICD-10-CM | POA: Diagnosis not present

## 2022-01-06 DIAGNOSIS — M5459 Other low back pain: Secondary | ICD-10-CM | POA: Diagnosis not present

## 2022-01-07 DIAGNOSIS — R82998 Other abnormal findings in urine: Secondary | ICD-10-CM | POA: Diagnosis not present

## 2022-01-07 DIAGNOSIS — Z85828 Personal history of other malignant neoplasm of skin: Secondary | ICD-10-CM | POA: Diagnosis not present

## 2022-01-07 DIAGNOSIS — E1169 Type 2 diabetes mellitus with other specified complication: Secondary | ICD-10-CM | POA: Diagnosis not present

## 2022-01-07 DIAGNOSIS — E039 Hypothyroidism, unspecified: Secondary | ICD-10-CM | POA: Diagnosis not present

## 2022-01-07 DIAGNOSIS — Z1331 Encounter for screening for depression: Secondary | ICD-10-CM | POA: Diagnosis not present

## 2022-01-07 DIAGNOSIS — Z0000 Encounter for general adult medical examination without abnormal findings: Secondary | ICD-10-CM | POA: Diagnosis not present

## 2022-01-07 DIAGNOSIS — I10 Essential (primary) hypertension: Secondary | ICD-10-CM | POA: Diagnosis not present

## 2022-01-07 DIAGNOSIS — R2681 Unsteadiness on feet: Secondary | ICD-10-CM | POA: Diagnosis not present

## 2022-01-20 DIAGNOSIS — M5459 Other low back pain: Secondary | ICD-10-CM | POA: Diagnosis not present

## 2022-01-20 DIAGNOSIS — R2681 Unsteadiness on feet: Secondary | ICD-10-CM | POA: Diagnosis not present

## 2022-01-20 DIAGNOSIS — M25551 Pain in right hip: Secondary | ICD-10-CM | POA: Diagnosis not present

## 2022-01-20 DIAGNOSIS — M25552 Pain in left hip: Secondary | ICD-10-CM | POA: Diagnosis not present

## 2022-05-31 DIAGNOSIS — H40013 Open angle with borderline findings, low risk, bilateral: Secondary | ICD-10-CM | POA: Diagnosis not present

## 2022-07-12 DIAGNOSIS — E039 Hypothyroidism, unspecified: Secondary | ICD-10-CM | POA: Diagnosis not present

## 2022-07-12 DIAGNOSIS — E1169 Type 2 diabetes mellitus with other specified complication: Secondary | ICD-10-CM | POA: Diagnosis not present

## 2022-07-12 DIAGNOSIS — R2681 Unsteadiness on feet: Secondary | ICD-10-CM | POA: Diagnosis not present

## 2022-07-12 DIAGNOSIS — I10 Essential (primary) hypertension: Secondary | ICD-10-CM | POA: Diagnosis not present

## 2022-07-12 DIAGNOSIS — Z85828 Personal history of other malignant neoplasm of skin: Secondary | ICD-10-CM | POA: Diagnosis not present

## 2022-07-12 DIAGNOSIS — M6740 Ganglion, unspecified site: Secondary | ICD-10-CM | POA: Diagnosis not present

## 2022-07-28 ENCOUNTER — Encounter: Payer: Self-pay | Admitting: Sports Medicine

## 2022-07-28 ENCOUNTER — Ambulatory Visit (INDEPENDENT_AMBULATORY_CARE_PROVIDER_SITE_OTHER): Payer: Medicare PPO

## 2022-07-28 ENCOUNTER — Ambulatory Visit: Payer: Self-pay

## 2022-07-28 ENCOUNTER — Ambulatory Visit: Payer: Medicare PPO | Admitting: Sports Medicine

## 2022-07-28 DIAGNOSIS — M7041 Prepatellar bursitis, right knee: Secondary | ICD-10-CM | POA: Diagnosis not present

## 2022-07-28 DIAGNOSIS — M25561 Pain in right knee: Secondary | ICD-10-CM

## 2022-07-28 DIAGNOSIS — G8929 Other chronic pain: Secondary | ICD-10-CM | POA: Diagnosis not present

## 2022-07-28 MED ORDER — LIDOCAINE HCL 1 % IJ SOLN
2.0000 mL | INTRAMUSCULAR | Status: AC | PRN
  Administered 2022-07-28: 2 mL

## 2022-07-28 MED ORDER — METHYLPREDNISOLONE ACETATE 40 MG/ML IJ SUSP
20.0000 mg | INTRAMUSCULAR | Status: AC | PRN
  Administered 2022-07-28: 20 mg via INTRA_ARTICULAR

## 2022-07-28 NOTE — Progress Notes (Signed)
Fell back in October Right knee pain; with a prepatellar bursa swelling It is somewhat squishy Denies any OTC medication  Not really any pain; just getting checked for reassurance

## 2022-07-28 NOTE — Progress Notes (Signed)
Mercedes Khan - 82 y.o. female MRN [id number]  Date of birth: [birth year]  Office Visit Note: Visit Date: 07/28/2022 PCP: Sueanne Margarita, DO Referred by: Sueanne Margarita, DO  Subjective: Chief Complaint  Patient presents with   Right Knee - Injury   HPI: Mercedes Khan is a pleasant 82 y.o. female who presents today for right knee pain and swelling.  Patient had a fall directly onto the right knee back in October.  She noted swelling over top of the kneecap at that time.  Was a little tender when she fell, however over the last little bit it does not cause her any pain.  She still has good range of motion about the knee.  She is not taking any medication for pain.  Denies any redness, warmth, or chills.  No prior injury to the knee.  Pertinent ROS were reviewed with the patient and found to be negative unless otherwise specified above in HPI.   Assessment & Plan: Visit Diagnoses:  1. Prepatellar bursitis of right knee   2. Chronic pain of right knee    Plan: Discussed with Maryellen and her son today the nature of her knee swelling, reviewed her ultrasound which does show prepatellar bursitis.  I assured her this is not coming from the knee joint.  Discussed all treatment options such as watchful waiting, compression therapy, topical medications and aspiration.  Patient decided on US-guided aspiration and injection to get rid of the swelling/bloody fluid.  This was done under ultrasound guidance, she tolerated this well.  We did wrap the knee with an Ace wrap.  Recommended compression over the coming weeks.  May use ice and/or Tylenol for any post-aspiration/injection pain.  She will follow-up as needed.   Follow-up: Return if symptoms worsen or fail to improve.   Meds & Orders: No orders of the defined types were placed in this encounter.   Orders Placed This Encounter  Procedures   Large Joint Inj   XR KNEE 3 VIEW RIGHT   [place] Extrem Low Right Ltd     Procedures: Large Joint  Inj: R knee on 07/28/2022 1:46 PM Indications: pain and joint swelling Details: 18 G 1.5 in needle, ultrasound-guided anterolateral approach Medications: 20 mg methylPREDNISolone acetate 40 MG/ML; 2 mL lidocaine 1 % Aspirate: 4 mL bloody Outcome: tolerated well, no immediate complications  Pre-patellar Knee Aspiration, right: After discussion on risks/benefits/indications was provided, informed verbal consent was obtained and a timeout was performed, patient was lying supine on exam table. The knee was prepped with alcohol swab.  Utilizing anterolateral approach, approximately 2 mL of lidocaine 1% was used for local anesthesia. Then using an 18g needle on 10cc syringe, 4 mL of clear bloody fluid was aspirated from the knee - prepatellar bursa. Utilizing the same portal, the knee bursa was then injected with 0.5cc of depomedrol. Band-aid was applied and knee wrapped with ACE compression. Patient tolerated procedure well without immediate complications.  Procedure, treatment alternatives, risks and benefits explained, specific risks discussed. Consent was given by the patient. Immediately prior to procedure a time out was called to verify the correct patient, procedure, equipment, support staff and site/side marked as required. Patient was prepped and draped in the usual sterile fashion.          Clinical History: No specialty comments available.  She reports that she quit smoking about 50 years ago. Her smoking use included cigarettes. She has never used smokeless tobacco. No results for input(s): "HGBA1C", "LABURIC" in the last 8760  hours.  Objective:    Physical Exam  Gen: Well-appearing, in no acute distress; non-toxic CV: Regular Rate. Well-perfused. Warm.  Resp: Breathing unlabored on room air; no wheezing. Psych: Fluid speech in conversation; appropriate affect; normal thought process Neuro: Sensation intact throughout. No gross coordination deficits.   Ortho Exam - Right knee:  Inspection of the knee demonstrates some prepatellar swelling, approximate size of a large ping-pong ball.  This is nontender.  There is no joint line tenderness.  Range of motion is preserved from 0-130 degrees.  5/5 strength about the knee. NVI.  Imaging: [place] Extrem Low Right Ltd  Result Date: 07/28/2022 Limited musculoskeletal ultrasound right knee was performed.  Ultrasound demonstrates a mixed hypoechoic, well-circumscribed structure superior to the patella.  This is approximately 2.16 x 0.48 cm in nature and long axis view.  No significant pain Doppler uptake.  Hypoechoic fluid structure appears to be within the prepatellar bursa.  There is no abnormality of the patellar tendon with appropriate insertion on the superior patella.  Suprapatellar pouch without abnormalities, no joint effusion. *Technically-Successful prepatellar ultrasound-guided aspiration and injection.  XR KNEE 3 VIEW RIGHT  Result Date: 07/28/2022 3 views of the right knee including bilateral AP standing, lateral and sunrise views were ordered and reviewed by myself.  X-rays demonstrate moderate tricompartment arthritis, worse on the medial joint line.  Contralateral left knee has severe bone-on-bone medial joint space narrowing.  There is a soft tissue swelling over the prepatellar area on the lateral view.  No acute fracture.  Vascular calcification noted.     Past Medical/Family/Surgical/Social History: Medications & Allergies reviewed per EMR, new medications updated. Patient Active Problem List   Diagnosis Date Noted   Diabetes ([facility]) 11/18/2012   Hypertension 10/03/2011   Hypothyroidism 10/03/2011   Past Medical History:  Diagnosis Date   Allergy    Arthritis    Cancer ([facility])    skin   Gallstones    Hypertension    Hypothyroidism    PONV (postoperative nausea and vomiting)    only after tonsillectomy at age 27yr   Pre-diabetes    Controlled with diet and exercise, last A1c 10-2016 was 8.7   Sleep apnea     use to wear a mask, lost 30 lbs   No family history on file. Past Surgical History:  Procedure Laterality Date   BREAST SURGERY     cyst removed from left breast   CHOLECYSTECTOMY N/A 03/09/2017   Procedure: LAPAROSCOPIC CHOLECYSTECTOMY WITH INTRAOPERATIVE CHOLANGIOGRAM;  Surgeon: BCoralie Keens MD;  Location: [facility]  Service: General;  Laterality: N/A;   THYROIDECTOMY     On the right side   TONSILLECTOMY     Wisdom teeth extracted     Social History   Occupational History   Not on file  Tobacco Use   Smoking status: Former    Types: Cigarettes    Quit date: 07/25/1972    Years since quitting: 50.0   Smokeless tobacco: Never   Tobacco comments:    Quit smoking years ago  Vaping Use   Vaping Use: Never used  Substance and Sexual Activity   Alcohol use: Yes    Comment: rarely   Drug use: No   Sexual activity: Not on file

## 2022-10-26 DIAGNOSIS — L989 Disorder of the skin and subcutaneous tissue, unspecified: Secondary | ICD-10-CM | POA: Diagnosis not present

## 2022-10-26 DIAGNOSIS — D485 Neoplasm of uncertain behavior of skin: Secondary | ICD-10-CM | POA: Diagnosis not present

## 2022-10-26 DIAGNOSIS — L821 Other seborrheic keratosis: Secondary | ICD-10-CM | POA: Diagnosis not present

## 2022-10-26 DIAGNOSIS — L570 Actinic keratosis: Secondary | ICD-10-CM | POA: Diagnosis not present

## 2022-11-22 DIAGNOSIS — D485 Neoplasm of uncertain behavior of skin: Secondary | ICD-10-CM | POA: Diagnosis not present

## 2022-11-22 DIAGNOSIS — L905 Scar conditions and fibrosis of skin: Secondary | ICD-10-CM | POA: Diagnosis not present

## 2022-11-22 DIAGNOSIS — D039 Melanoma in situ, unspecified: Secondary | ICD-10-CM | POA: Diagnosis not present

## 2022-11-22 DIAGNOSIS — D0372 Melanoma in situ of left lower limb, including hip: Secondary | ICD-10-CM | POA: Diagnosis not present

## 2022-11-23 DIAGNOSIS — D0372 Melanoma in situ of left lower limb, including hip: Secondary | ICD-10-CM | POA: Diagnosis not present

## 2022-12-12 DIAGNOSIS — H40013 Open angle with borderline findings, low risk, bilateral: Secondary | ICD-10-CM | POA: Diagnosis not present

## 2023-01-31 DIAGNOSIS — E559 Vitamin D deficiency, unspecified: Secondary | ICD-10-CM | POA: Diagnosis not present

## 2023-01-31 DIAGNOSIS — E039 Hypothyroidism, unspecified: Secondary | ICD-10-CM | POA: Diagnosis not present

## 2023-01-31 DIAGNOSIS — I10 Essential (primary) hypertension: Secondary | ICD-10-CM | POA: Diagnosis not present

## 2023-01-31 DIAGNOSIS — E1169 Type 2 diabetes mellitus with other specified complication: Secondary | ICD-10-CM | POA: Diagnosis not present

## 2023-02-07 DIAGNOSIS — E559 Vitamin D deficiency, unspecified: Secondary | ICD-10-CM | POA: Diagnosis not present

## 2023-02-07 DIAGNOSIS — Z0000 Encounter for general adult medical examination without abnormal findings: Secondary | ICD-10-CM | POA: Diagnosis not present

## 2023-02-07 DIAGNOSIS — R82998 Other abnormal findings in urine: Secondary | ICD-10-CM | POA: Diagnosis not present

## 2023-02-07 DIAGNOSIS — E1169 Type 2 diabetes mellitus with other specified complication: Secondary | ICD-10-CM | POA: Diagnosis not present

## 2023-02-07 DIAGNOSIS — E039 Hypothyroidism, unspecified: Secondary | ICD-10-CM | POA: Diagnosis not present

## 2023-02-07 DIAGNOSIS — R2681 Unsteadiness on feet: Secondary | ICD-10-CM | POA: Diagnosis not present

## 2023-02-07 DIAGNOSIS — Z85828 Personal history of other malignant neoplasm of skin: Secondary | ICD-10-CM | POA: Diagnosis not present

## 2023-02-07 DIAGNOSIS — M6740 Ganglion, unspecified site: Secondary | ICD-10-CM | POA: Diagnosis not present

## 2023-02-07 DIAGNOSIS — I10 Essential (primary) hypertension: Secondary | ICD-10-CM | POA: Diagnosis not present

## 2023-02-22 DIAGNOSIS — L309 Dermatitis, unspecified: Secondary | ICD-10-CM | POA: Diagnosis not present

## 2023-02-22 DIAGNOSIS — D1801 Hemangioma of skin and subcutaneous tissue: Secondary | ICD-10-CM | POA: Diagnosis not present

## 2023-02-22 DIAGNOSIS — L905 Scar conditions and fibrosis of skin: Secondary | ICD-10-CM | POA: Diagnosis not present

## 2023-02-22 DIAGNOSIS — L578 Other skin changes due to chronic exposure to nonionizing radiation: Secondary | ICD-10-CM | POA: Diagnosis not present

## 2023-06-15 DIAGNOSIS — E119 Type 2 diabetes mellitus without complications: Secondary | ICD-10-CM | POA: Diagnosis not present

## 2023-06-15 DIAGNOSIS — H40013 Open angle with borderline findings, low risk, bilateral: Secondary | ICD-10-CM | POA: Diagnosis not present

## 2023-07-04 DIAGNOSIS — R2681 Unsteadiness on feet: Secondary | ICD-10-CM | POA: Diagnosis not present

## 2023-07-04 DIAGNOSIS — E559 Vitamin D deficiency, unspecified: Secondary | ICD-10-CM | POA: Diagnosis not present

## 2023-07-04 DIAGNOSIS — Z85828 Personal history of other malignant neoplasm of skin: Secondary | ICD-10-CM | POA: Diagnosis not present

## 2023-07-04 DIAGNOSIS — I10 Essential (primary) hypertension: Secondary | ICD-10-CM | POA: Diagnosis not present

## 2023-07-04 DIAGNOSIS — E1169 Type 2 diabetes mellitus with other specified complication: Secondary | ICD-10-CM | POA: Diagnosis not present

## 2023-07-04 DIAGNOSIS — E039 Hypothyroidism, unspecified: Secondary | ICD-10-CM | POA: Diagnosis not present

## 2023-08-03 DIAGNOSIS — R262 Difficulty in walking, not elsewhere classified: Secondary | ICD-10-CM | POA: Diagnosis not present

## 2023-08-03 DIAGNOSIS — M6281 Muscle weakness (generalized): Secondary | ICD-10-CM | POA: Diagnosis not present

## 2023-08-11 DIAGNOSIS — R262 Difficulty in walking, not elsewhere classified: Secondary | ICD-10-CM | POA: Diagnosis not present

## 2023-08-11 DIAGNOSIS — M6281 Muscle weakness (generalized): Secondary | ICD-10-CM | POA: Diagnosis not present

## 2023-08-15 DIAGNOSIS — M6281 Muscle weakness (generalized): Secondary | ICD-10-CM | POA: Diagnosis not present

## 2023-08-15 DIAGNOSIS — R262 Difficulty in walking, not elsewhere classified: Secondary | ICD-10-CM | POA: Diagnosis not present

## 2023-08-17 DIAGNOSIS — M6281 Muscle weakness (generalized): Secondary | ICD-10-CM | POA: Diagnosis not present

## 2023-08-17 DIAGNOSIS — R262 Difficulty in walking, not elsewhere classified: Secondary | ICD-10-CM | POA: Diagnosis not present

## 2023-08-18 DIAGNOSIS — Z85828 Personal history of other malignant neoplasm of skin: Secondary | ICD-10-CM | POA: Diagnosis not present

## 2023-08-18 DIAGNOSIS — E1169 Type 2 diabetes mellitus with other specified complication: Secondary | ICD-10-CM | POA: Diagnosis not present

## 2023-08-18 DIAGNOSIS — C439 Malignant melanoma of skin, unspecified: Secondary | ICD-10-CM | POA: Diagnosis not present

## 2023-08-18 DIAGNOSIS — I10 Essential (primary) hypertension: Secondary | ICD-10-CM | POA: Diagnosis not present

## 2023-08-18 DIAGNOSIS — R2681 Unsteadiness on feet: Secondary | ICD-10-CM | POA: Diagnosis not present

## 2023-08-18 DIAGNOSIS — E559 Vitamin D deficiency, unspecified: Secondary | ICD-10-CM | POA: Diagnosis not present

## 2023-08-18 DIAGNOSIS — E039 Hypothyroidism, unspecified: Secondary | ICD-10-CM | POA: Diagnosis not present

## 2023-08-18 DIAGNOSIS — M6740 Ganglion, unspecified site: Secondary | ICD-10-CM | POA: Diagnosis not present

## 2023-08-21 DIAGNOSIS — R262 Difficulty in walking, not elsewhere classified: Secondary | ICD-10-CM | POA: Diagnosis not present

## 2023-08-21 DIAGNOSIS — M6281 Muscle weakness (generalized): Secondary | ICD-10-CM | POA: Diagnosis not present

## 2023-08-23 DIAGNOSIS — M6281 Muscle weakness (generalized): Secondary | ICD-10-CM | POA: Diagnosis not present

## 2023-08-23 DIAGNOSIS — R262 Difficulty in walking, not elsewhere classified: Secondary | ICD-10-CM | POA: Diagnosis not present

## 2023-08-28 DIAGNOSIS — R262 Difficulty in walking, not elsewhere classified: Secondary | ICD-10-CM | POA: Diagnosis not present

## 2023-08-28 DIAGNOSIS — M6281 Muscle weakness (generalized): Secondary | ICD-10-CM | POA: Diagnosis not present

## 2023-08-31 DIAGNOSIS — R262 Difficulty in walking, not elsewhere classified: Secondary | ICD-10-CM | POA: Diagnosis not present

## 2023-08-31 DIAGNOSIS — M6281 Muscle weakness (generalized): Secondary | ICD-10-CM | POA: Diagnosis not present

## 2023-09-07 DIAGNOSIS — M6281 Muscle weakness (generalized): Secondary | ICD-10-CM | POA: Diagnosis not present

## 2023-09-07 DIAGNOSIS — R262 Difficulty in walking, not elsewhere classified: Secondary | ICD-10-CM | POA: Diagnosis not present

## 2023-09-11 DIAGNOSIS — R262 Difficulty in walking, not elsewhere classified: Secondary | ICD-10-CM | POA: Diagnosis not present

## 2023-09-11 DIAGNOSIS — M6281 Muscle weakness (generalized): Secondary | ICD-10-CM | POA: Diagnosis not present

## 2023-09-18 DIAGNOSIS — M6281 Muscle weakness (generalized): Secondary | ICD-10-CM | POA: Diagnosis not present

## 2023-09-18 DIAGNOSIS — R262 Difficulty in walking, not elsewhere classified: Secondary | ICD-10-CM | POA: Diagnosis not present

## 2023-09-21 DIAGNOSIS — R262 Difficulty in walking, not elsewhere classified: Secondary | ICD-10-CM | POA: Diagnosis not present

## 2023-09-21 DIAGNOSIS — M6281 Muscle weakness (generalized): Secondary | ICD-10-CM | POA: Diagnosis not present

## 2023-09-25 DIAGNOSIS — R262 Difficulty in walking, not elsewhere classified: Secondary | ICD-10-CM | POA: Diagnosis not present

## 2023-09-25 DIAGNOSIS — M6281 Muscle weakness (generalized): Secondary | ICD-10-CM | POA: Diagnosis not present

## 2023-09-28 DIAGNOSIS — R262 Difficulty in walking, not elsewhere classified: Secondary | ICD-10-CM | POA: Diagnosis not present

## 2023-09-28 DIAGNOSIS — M6281 Muscle weakness (generalized): Secondary | ICD-10-CM | POA: Diagnosis not present

## 2023-10-02 DIAGNOSIS — R262 Difficulty in walking, not elsewhere classified: Secondary | ICD-10-CM | POA: Diagnosis not present

## 2023-10-02 DIAGNOSIS — M6281 Muscle weakness (generalized): Secondary | ICD-10-CM | POA: Diagnosis not present

## 2023-10-05 DIAGNOSIS — M6281 Muscle weakness (generalized): Secondary | ICD-10-CM | POA: Diagnosis not present

## 2023-10-05 DIAGNOSIS — R262 Difficulty in walking, not elsewhere classified: Secondary | ICD-10-CM | POA: Diagnosis not present

## 2023-10-30 DIAGNOSIS — E1169 Type 2 diabetes mellitus with other specified complication: Secondary | ICD-10-CM | POA: Diagnosis not present

## 2023-10-30 DIAGNOSIS — I10 Essential (primary) hypertension: Secondary | ICD-10-CM | POA: Diagnosis not present

## 2023-10-30 DIAGNOSIS — E039 Hypothyroidism, unspecified: Secondary | ICD-10-CM | POA: Diagnosis not present

## 2023-10-30 DIAGNOSIS — R2681 Unsteadiness on feet: Secondary | ICD-10-CM | POA: Diagnosis not present

## 2023-10-30 DIAGNOSIS — R002 Palpitations: Secondary | ICD-10-CM | POA: Diagnosis not present

## 2023-10-30 DIAGNOSIS — R202 Paresthesia of skin: Secondary | ICD-10-CM | POA: Diagnosis not present

## 2023-10-30 DIAGNOSIS — E559 Vitamin D deficiency, unspecified: Secondary | ICD-10-CM | POA: Diagnosis not present

## 2023-10-30 DIAGNOSIS — R471 Dysarthria and anarthria: Secondary | ICD-10-CM | POA: Diagnosis not present

## 2023-10-31 ENCOUNTER — Other Ambulatory Visit: Payer: Self-pay | Admitting: Nurse Practitioner

## 2023-10-31 DIAGNOSIS — R471 Dysarthria and anarthria: Secondary | ICD-10-CM

## 2023-10-31 DIAGNOSIS — I10 Essential (primary) hypertension: Secondary | ICD-10-CM

## 2023-10-31 DIAGNOSIS — R202 Paresthesia of skin: Secondary | ICD-10-CM

## 2023-11-07 ENCOUNTER — Other Ambulatory Visit

## 2023-11-15 ENCOUNTER — Ambulatory Visit: Admitting: Diagnostic Neuroimaging

## 2023-11-15 ENCOUNTER — Ambulatory Visit
Admission: RE | Admit: 2023-11-15 | Discharge: 2023-11-15 | Discharge status: Home / Self Care | Source: Ambulatory Visit | Attending: Nurse Practitioner

## 2023-11-15 DIAGNOSIS — R202 Paresthesia of skin: Secondary | ICD-10-CM

## 2023-11-15 DIAGNOSIS — I10 Essential (primary) hypertension: Secondary | ICD-10-CM

## 2023-11-15 DIAGNOSIS — E785 Hyperlipidemia, unspecified: Secondary | ICD-10-CM | POA: Diagnosis not present

## 2023-11-15 DIAGNOSIS — R471 Dysarthria and anarthria: Secondary | ICD-10-CM

## 2023-11-16 ENCOUNTER — Ambulatory Visit: Admitting: Neurology

## 2023-11-16 ENCOUNTER — Encounter: Payer: Self-pay | Admitting: Neurology

## 2023-11-16 VITALS — BP 138/74 | HR 84 | Ht 62.0 in | Wt 166.0 lb

## 2023-11-16 DIAGNOSIS — R471 Dysarthria and anarthria: Secondary | ICD-10-CM | POA: Diagnosis not present

## 2023-11-16 DIAGNOSIS — G459 Transient cerebral ischemic attack, unspecified: Secondary | ICD-10-CM | POA: Diagnosis not present

## 2023-11-16 NOTE — Progress Notes (Signed)
 Chief Complaint  Patient presents with   New Patient (Initial Visit)    Pt in 15, here with son Mercedes Khan  Pt is referred for transient dysarthria and lower lip paresthesia.       ASSESSMENT AND PLAN  Mercedes Khan is a 83 y.o. female   TIA  Transient dysarthria, paresthesia  Vascular risk factor of aging hypertension hyperlipidemia  On aspirin 81 mg  Advised her increase water intake  Complete evaluation with echocardiogram, MRI of the brain  DIAGNOSTIC DATA (LABS, IMAGING, TESTING) - I reviewed patient records, labs, notes, testing and imaging myself where available.   MEDICAL HISTORY:  Mercedes Khan is a 83 year old female accompanied by her son, seen in request by [facility] Dr.  Windell Hasty, for evaluation of transient speech difficulty, initial evaluation November 16, 2023   History is obtained from the patient and review of electronic medical records. I personally reviewed pertinent available imaging films in PACS.   PMHx of  HTN Hypothyrodism  She lives with her son, on November 06, 2023, she had a transient slurred speech, lasting for couple minutes, also lower lip numbness, but there was no other focal signs  Was seen by her primary care physician, reported no significant abnormality on EKG, started on aspirin 81 mg daily, mild abnormal LDL, just got red yeast, hesitate to start statin    PHYSICAL EXAM:   Vitals:   11/16/23 1439  BP: 138/74  Pulse: 84  Weight: 166 lb (75.3 kg)  Height: 5\' 2"  (1.575 m)    Body mass index is 30.36 kg/m.  PHYSICAL EXAMNIATION:  Gen: NAD, conversant, well nourised, well groomed                     Cardiovascular: Regular rate rhythm, no peripheral edema, warm, nontender. Eyes: Conjunctivae clear without exudates or hemorrhage Neck: Supple, no carotid bruits. Pulmonary: Clear to auscultation bilaterally   NEUROLOGICAL EXAM:  MENTAL STATUS: Speech/cognition: Awake, alert, oriented to history taking and  casual conversation CRANIAL NERVES: CN II: Visual fields are full to confrontation. Pupils are round equal and briskly reactive to light. CN III, IV, VI: extraocular movement are normal. No ptosis. CN V: Facial sensation is intact to light touch CN VII: Face is symmetric with normal eye closure  CN VIII: Hearing is normal to causal conversation. CN IX, X: Phonation is normal. CN XI: Head turning and shoulder shrug are intact  MOTOR: There is no pronator drift of out-stretched arms. Muscle bulk and tone are normal. Muscle strength is normal.  REFLEXES: Reflexes are 1 and symmetric at the biceps, triceps, knees, and ankles. Plantar responses are flexor.  SENSORY: Intact to light touch, pinprick and vibratory sensation are intact in fingers and toes.  COORDINATION: There is no trunk or limb dysmetria noted.  GAIT/STANCE: Push-up to get up from seated position, rely on her walker, antalgic  REVIEW OF SYSTEMS:  Full 14 system review of systems performed and notable only for as above All other review of systems were negative.   ALLERGIES: No Known Allergies  HOME MEDICATIONS: Current Outpatient Medications  Medication Sig Dispense Refill   aspirin EC 81 MG tablet Take 81 mg by mouth daily. Swallow whole.     Cholecalciferol (VITAMIN D PO) Take by mouth.     lisinopril (PRINIVIL,ZESTRIL) 30 MG tablet Take 30 mg by mouth daily.     thyroid  (ARMOUR) 30 MG tablet Take 30 mg by mouth daily.     No  current facility-administered medications for this visit.    PAST MEDICAL HISTORY: Past Medical History:  Diagnosis Date   Allergy    Arthritis    Cancer ([facility])    skin   Gallstones    Hypertension    Hypothyroidism    PONV (postoperative nausea and vomiting)    only after tonsillectomy at age 59yrs   Pre-diabetes    Controlled with diet and exercise, last A1c 10-2016 was 8.7   Sleep apnea    use to wear a mask, lost 30 lbs    PAST SURGICAL HISTORY: Past Surgical History:   Procedure Laterality Date   BREAST SURGERY     cyst removed from left breast   CHOLECYSTECTOMY N/A 03/09/2017   Procedure: LAPAROSCOPIC CHOLECYSTECTOMY WITH INTRAOPERATIVE CHOLANGIOGRAM;  Surgeon: Oza Blumenthal, MD;  Location: [facility];  Service: General;  Laterality: N/A;   THYROIDECTOMY     On the right side   TONSILLECTOMY     Wisdom teeth extracted      FAMILY HISTORY: History reviewed. No pertinent family history.  SOCIAL HISTORY: Social History   Socioeconomic History   Marital status: Legally Separated    Spouse name: Not on file   Number of children: Not on file   Years of education: Not on file   Highest education level: Not on file  Occupational History   Not on file  Tobacco Use   Smoking status: Former    Current packs/day: 0.00    Types: Cigarettes    Quit date: 07/25/1972    Years since quitting: 51.3   Smokeless tobacco: Never   Tobacco comments:    Quit smoking years ago  Vaping Use   Vaping status: Never Used  Substance and Sexual Activity   Alcohol use: Yes    Comment: rarely   Drug use: No   Sexual activity: Not on file  Other Topics Concern   Not on file  Social History Narrative   Not on file   Social Drivers of Health   Financial Resource Strain: Not on file  Food Insecurity: Not on file  Transportation Needs: Not on file  Physical Activity: Not on file  Stress: Not on file  Social Connections: Not on file  Intimate Partner Violence: Not on file      Phebe Brasil, M.D. Ph.D.  [facility] [address] [place], [place] [address] Ph: [phone] Fax: [phone]  CC:  Debbie Fails, NP [address] [place],  [place] [address]  Windell Hasty, DO

## 2023-11-22 ENCOUNTER — Telehealth: Payer: Self-pay | Admitting: Neurology

## 2023-11-22 NOTE — Telephone Encounter (Signed)
 Mercedes Khan Siegfried Dress: [id number] exp. 11/22/23-01/21/24 sent to GI [phone]

## 2023-11-28 ENCOUNTER — Telehealth: Payer: Self-pay | Admitting: Neurology

## 2023-11-28 ENCOUNTER — Ambulatory Visit
Admission: RE | Admit: 2023-11-28 | Discharge: 2023-11-28 | Disposition: A | Discharge status: Home / Self Care | Source: Ambulatory Visit | Attending: Neurology | Admitting: Neurology

## 2023-11-28 DIAGNOSIS — G459 Transient cerebral ischemic attack, unspecified: Secondary | ICD-10-CM | POA: Diagnosis not present

## 2023-11-28 DIAGNOSIS — I10 Essential (primary) hypertension: Secondary | ICD-10-CM | POA: Diagnosis not present

## 2023-11-28 DIAGNOSIS — R471 Dysarthria and anarthria: Secondary | ICD-10-CM

## 2023-11-28 NOTE — Telephone Encounter (Signed)
 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

## 2023-11-28 NOTE — Telephone Encounter (Signed)
 Called and relayed result:  MRI of the brain showed age-related changes, there was no acute abnormality, continue treatment plan as discussed during office visit   Pt voiced gratitude and understanding of all discussed

## 2023-12-15 DIAGNOSIS — H40013 Open angle with borderline findings, low risk, bilateral: Secondary | ICD-10-CM | POA: Diagnosis not present

## 2023-12-20 ENCOUNTER — Ambulatory Visit (HOSPITAL_BASED_OUTPATIENT_CLINIC_OR_DEPARTMENT_OTHER)

## 2023-12-20 DIAGNOSIS — R471 Dysarthria and anarthria: Secondary | ICD-10-CM

## 2023-12-20 DIAGNOSIS — G459 Transient cerebral ischemic attack, unspecified: Secondary | ICD-10-CM

## 2023-12-20 LAB — ECHOCARDIOGRAM COMPLETE
AR max vel: 1.06 cm2
AV Area VTI: 0.99 cm2
AV Area mean vel: 0.97 cm2
AV Mean grad: 12 mmHg
AV Peak grad: 22.3 mmHg
Ao pk vel: 2.36 m/s
Area-P 1/2: 3.27 cm2
S' Lateral: 2.53 cm

## 2023-12-22 ENCOUNTER — Ambulatory Visit: Payer: Self-pay | Admitting: Neurology

## 2024-01-26 DIAGNOSIS — E039 Hypothyroidism, unspecified: Secondary | ICD-10-CM | POA: Diagnosis not present

## 2024-02-02 DIAGNOSIS — I10 Essential (primary) hypertension: Secondary | ICD-10-CM | POA: Diagnosis not present

## 2024-02-02 DIAGNOSIS — E559 Vitamin D deficiency, unspecified: Secondary | ICD-10-CM | POA: Diagnosis not present

## 2024-02-02 DIAGNOSIS — E039 Hypothyroidism, unspecified: Secondary | ICD-10-CM | POA: Diagnosis not present

## 2024-02-02 DIAGNOSIS — E1169 Type 2 diabetes mellitus with other specified complication: Secondary | ICD-10-CM | POA: Diagnosis not present

## 2024-02-09 DIAGNOSIS — R82998 Other abnormal findings in urine: Secondary | ICD-10-CM | POA: Diagnosis not present

## 2024-02-09 DIAGNOSIS — E1169 Type 2 diabetes mellitus with other specified complication: Secondary | ICD-10-CM | POA: Diagnosis not present

## 2024-02-09 DIAGNOSIS — Z8673 Personal history of transient ischemic attack (TIA), and cerebral infarction without residual deficits: Secondary | ICD-10-CM | POA: Diagnosis not present

## 2024-02-09 DIAGNOSIS — E039 Hypothyroidism, unspecified: Secondary | ICD-10-CM | POA: Diagnosis not present

## 2024-02-09 DIAGNOSIS — I739 Peripheral vascular disease, unspecified: Secondary | ICD-10-CM | POA: Diagnosis not present

## 2024-02-09 DIAGNOSIS — Z0000 Encounter for general adult medical examination without abnormal findings: Secondary | ICD-10-CM | POA: Diagnosis not present

## 2024-02-09 DIAGNOSIS — I10 Essential (primary) hypertension: Secondary | ICD-10-CM | POA: Diagnosis not present

## 2024-02-09 DIAGNOSIS — Z1331 Encounter for screening for depression: Secondary | ICD-10-CM | POA: Diagnosis not present

## 2024-02-09 DIAGNOSIS — C439 Malignant melanoma of skin, unspecified: Secondary | ICD-10-CM | POA: Diagnosis not present

## 2024-02-09 DIAGNOSIS — G720 Drug-induced myopathy: Secondary | ICD-10-CM | POA: Diagnosis not present

## 2024-03-19 NOTE — Progress Notes (Unsigned)
 Cardiology Office Note:    Date:  03/21/2024   ID:  Mercedes Khan, DOB 1941-04-11, MRN [id number]  PCP:  Valentin Skates, DO  Cardiologist:  None  Electrophysiologist:  None   Referring MD: Angelia Pierce, NP   Chief Complaint  Patient presents with   Palpitations    History of Present Illness:    Mercedes Khan is a 83 y.o. female with a hx of hypertension, hypothyroidism, prediabetes, OSA, TIA who is referred by Pierce Angelia, NP for evaluation of palpitations.  She reports that in May she was walking in her kitchen and then fell to the ground.  Subsequently had slurred speech for few minutes.  She did not seek medical attention at that time but was seen by neurology as outpatient and had brain MRI which did not show evidence of acute CVA.  Felt to likely have TIA.  She denies any chest pain, dyspnea, lightheadedness, syncope, lower extremity edema, or palpitations.  States that for exercise she will walk up and down flight of stairs at her home about 7 times in a row.  Denies exertional symptoms.  Reports BP has been 130s to 140s over 80s to 90s at home.  Echocardiogram 11/2023 showed normal biventricular function, mild biatrial enlargement, moderate to severe mitral annular calcification, mild aortic stenosis.  Past Medical History:  Diagnosis Date   Allergy    Arthritis    Cancer (HCC)    skin   Gallstones    Hypertension    Hypothyroidism    PONV (postoperative nausea and vomiting)    only after tonsillectomy at age 55yrs   Pre-diabetes    Controlled with diet and exercise, last A1c 10-2016 was 8.7   Sleep apnea    use to wear a mask, lost 30 lbs    Past Surgical History:  Procedure Laterality Date   BREAST SURGERY     cyst removed from left breast   CHOLECYSTECTOMY N/A 03/09/2017   Procedure: LAPAROSCOPIC CHOLECYSTECTOMY WITH INTRAOPERATIVE CHOLANGIOGRAM;  Surgeon: Vernetta Berg, MD;  Location: [facility];  Service: General;  Laterality: N/A;    THYROIDECTOMY     On the right side   TONSILLECTOMY     Wisdom teeth extracted      Current Medications: Current Meds  Medication Sig   aspirin EC 81 MG tablet Take 81 mg by mouth daily. Swallow whole.   Cholecalciferol (VITAMIN D PO) Take by mouth.   Cyanocobalamin (VITAMIN B 12 PO) Take 1 tablet by mouth daily at 6 (six) AM.   lisinopril (PRINIVIL,ZESTRIL) 30 MG tablet Take 30 mg by mouth daily.   rosuvastatin  (CRESTOR ) 5 MG tablet Take 1 tablet (5 mg total) by mouth daily.   thyroid  (ARMOUR) 30 MG tablet Take 30 mg by mouth daily. (Patient taking differently: Take 30 mg by mouth.)     Allergies:   Patient has no known allergies.   Social History   Socioeconomic History   Marital status: Legally Separated    Spouse name: Not on file   Number of children: Not on file   Years of education: Not on file   Highest education level: Not on file  Occupational History   Not on file  Tobacco Use   Smoking status: Former    Current packs/day: 0.00    Types: Cigarettes    Quit date: 07/25/1972    Years since quitting: 51.6   Smokeless tobacco: Never   Tobacco comments:    Quit smoking years ago  Vaping Use  Vaping status: Never Used  Substance and Sexual Activity   Alcohol use: Yes    Comment: rarely   Drug use: No   Sexual activity: Not on file  Other Topics Concern   Not on file  Social History Narrative   Not on file   Social Drivers of Health   Financial Resource Strain: Not on file  Food Insecurity: Not on file  Transportation Needs: Not on file  Physical Activity: Not on file  Stress: Not on file  Social Connections: Not on file     Family History: The patient's family history is not on file.  ROS:   Please see the history of present illness.     All other systems reviewed and are negative.  EKGs/Labs/Other Studies Reviewed:    The following studies were reviewed today:   EKG:   03/21/2024: Normal sinus rhythm, rate 82, no ST abnormalities, QTc  472  Recent Labs: No results found for requested labs within last 365 days.  Recent Lipid Panel No results found for: CHOL, TRIG, HDL, CHOLHDL, VLDL, LDLCALC, LDLDIRECT  Physical Exam:    VS:  BP (!) 174/60 (BP Location: Left Arm, Patient Position: Sitting, Cuff Size: Large)   Pulse 82   Ht 5' 2.5 (1.588 m)   Wt 168 lb 3.2 oz (76.3 kg)   SpO2 98%   BMI 30.27 kg/m     Wt Readings from Last 3 Encounters:  03/21/24 168 lb 3.2 oz (76.3 kg)  11/16/23 166 lb (75.3 kg)  03/17/17 195 lb 6.4 oz (88.6 kg)     GEN:  Well nourished, well developed in no acute distress HEENT: Normal NECK: No JVD; No carotid bruits LYMPHATICS: No lymphadenopathy CARDIAC: RRR, no murmurs, rubs, gallops RESPIRATORY:  Clear to auscultation without rales, wheezing or rhonchi  ABDOMEN: Soft, non-tender, non-distended MUSCULOSKELETAL:  No edema; No deformity  SKIN: Warm and dry NEUROLOGIC:  Alert and oriented x 3 PSYCHIATRIC:  Normal affect   ASSESSMENT:    1. TIA (transient ischemic attack)   2. Hypertension, unspecified type   3. Hyperlipidemia, unspecified hyperlipidemia type   4. Aortic valve stenosis, etiology of cardiac valve disease unspecified    PLAN:    TIA: Echocardiogram 11/2023 showed normal biventricular function, mild biatrial enlargement, moderate to severe mitral annular calcification, mild aortic stenosis.  Recommend Zio patch x 2 weeks to evaluate for A-fib.  Continue aspirin.  Start rosuvastatin  5 mg daily  Aortic stenosis: Mild on echocardiogram 11/2023.  Will monitor  Hypertension: On lisinopril 30 mg daily.  BP elevated.  Asked to check BP twice daily for next week and let us  know results  Hyperlipidemia: LDL 111 on 01/2024.  Start rosuvastatin  5 mg daily.  Check fasting lipid panel in 3 months  RTC in 3 months   Medication Adjustments/Labs and Tests Ordered: Current medicines are reviewed at length with the patient today.  Concerns regarding medicines are  outlined above.  Orders Placed This Encounter  Procedures   Lipid Profile   LONG TERM MONITOR (3-14 DAYS)   EKG 12-Lead   Meds ordered this encounter  Medications   rosuvastatin  (CRESTOR ) 5 MG tablet    Sig: Take 1 tablet (5 mg total) by mouth daily.    Dispense:  30 tablet    Refill:  6    Patient Instructions  Medication Instructions:  Your physician has recommended you make the following change in your medication:  START Rosuvastatin  (Crestor ) 5 mg daily  *If you need a refill  on your cardiac medications before your next appointment, please call your pharmacy*  Lab Work: Your physician recommends that you return for lab work in: 3 months for FASTING lipid profile  If you have any lab test that is abnormal or we need to change your treatment, we will call you to review the results.  Testing/Procedures:                           GEOFFRY HEWS- Long Term Monitor Instructions  Your physician has requested you wear a ZIO patch monitor for 14 days.  This is a single patch monitor. [employer] supplies one patch monitor per enrollment. Additional stickers are not available. Please do not apply patch if you will be having a Nuclear Stress Test,  Echocardiogram, Cardiac CT, MRI, or Chest Xray during the period you would be wearing the  monitor. The patch cannot be worn during these tests. You cannot remove and re-apply the  ZIO XT patch monitor.  Your ZIO patch monitor will be mailed 3 day USPS to your address on file. It may take 3-5 days  to receive your monitor after you have been enrolled.  Once you have received your monitor, please review the enclosed instructions. Your monitor  has already been registered assigning a specific monitor serial # to you.  Billing and Patient Assistance Program Information  We have supplied [employer] with any of your insurance information on file for billing purposes. [employer] offers a sliding scale Patient Assistance Program for patients that do not have   insurance, or whose insurance does not completely cover the cost of the ZIO monitor.  You must apply for the Patient Assistance Program to qualify for this discounted rate.  To apply, please call [employer] at [phone], select option 4, select option 2, ask to apply for  Patient Assistance Program. [name] will ask your household income, and how many people  are in your household. They will quote your out-of-pocket cost based on that information.  [employer] will also be able to set up a 20-month, interest-free payment plan if needed.  Applying the monitor   Shave hair from upper left chest.  Hold abrader disc by orange tab. Rub abrader in 40 strokes over the upper left chest as  indicated in your monitor instructions.  Clean area with 4 enclosed alcohol pads. Let dry.  Apply patch as indicated in monitor instructions. Patch will be placed under collarbone on left  side of chest with arrow pointing upward.  Rub patch adhesive wings for 2 minutes. Remove white label marked 1. Remove the white  label marked 2. Rub patch adhesive wings for 2 additional minutes.  While looking in a mirror, press and release button in center of patch. A small green light will  flash 3-4 times. This will be your only indicator that the monitor has been turned on.  Do not shower for the first 24 hours. You may shower after the first 24 hours.  Press the button if you feel a symptom. You will hear a small click. Record Date, Time and  Symptom in the Patient Logbook.  When you are ready to remove the patch, follow instructions on the last 2 pages of Patient  Logbook. Stick patch monitor onto the last page of Patient Logbook.  Place Patient Logbook in the blue and white box. Use locking tab on box and tape box closed  securely. The blue and white box has prepaid postage on it.  Please place it in the mailbox as  soon as possible. Your physician should have your test results approximately 7 days after the  monitor  has been mailed back to [facility]  Call [facility] Customer Care at [phone] if you have questions regarding  your ZIO XT patch monitor. Call them immediately if you see an orange light blinking on your  monitor.  If your monitor falls off in less than 4 days, contact our Monitor department at [phone].  If your monitor becomes loose or falls off after 4 days call [employer] at [phone] for  suggestions on securing your monitor    Follow-Up: At [facility], you and your health needs are our priority.  As part of our continuing mission to provide you with exceptional heart care, our providers are all part of one team.  This team includes your primary Cardiologist (physician) and Advanced Practice Providers or APPs (Physician Assistants and Nurse Practitioners) who all work together to provide you with the care you need, when you need it.  Your next appointment:   3 month(s)  Provider:   Dr. Kate   We recommend signing up for the patient portal called MyChart.  Sign up information is provided on this After Visit Summary.  MyChart is used to connect with patients for Virtual Visits (Telemedicine).  Patients are able to view lab/test results, encounter notes, upcoming appointments, etc.  Non-urgent messages can be sent to your provider as well.   To learn more about what you can do with MyChart, go to [email or web address].   Thank you for choosing [facility]!!   [phone]   Other Instructions   Check your blood  pressure twice daily for the next week and call the office with the results       Signed, Lonni LITTIE Kate, MD  03/21/2024 6:10 PM    [facility]

## 2024-03-21 ENCOUNTER — Ambulatory Visit: Attending: Cardiology | Admitting: Cardiology

## 2024-03-21 ENCOUNTER — Ambulatory Visit (INDEPENDENT_AMBULATORY_CARE_PROVIDER_SITE_OTHER)

## 2024-03-21 ENCOUNTER — Encounter: Payer: Self-pay | Admitting: Cardiology

## 2024-03-21 VITALS — BP 174/60 | HR 82 | Ht 62.5 in | Wt 168.2 lb

## 2024-03-21 DIAGNOSIS — I350 Nonrheumatic aortic (valve) stenosis: Secondary | ICD-10-CM | POA: Diagnosis not present

## 2024-03-21 DIAGNOSIS — I10 Essential (primary) hypertension: Secondary | ICD-10-CM | POA: Diagnosis not present

## 2024-03-21 DIAGNOSIS — G459 Transient cerebral ischemic attack, unspecified: Secondary | ICD-10-CM

## 2024-03-21 DIAGNOSIS — E785 Hyperlipidemia, unspecified: Secondary | ICD-10-CM | POA: Diagnosis not present

## 2024-03-21 MED ORDER — ROSUVASTATIN CALCIUM 5 MG PO TABS: 5.0000 mg | ORAL_TABLET | Freq: Every day | ORAL | 6 refills | Status: DC

## 2024-03-21 NOTE — Patient Instructions (Signed)
 Medication Instructions:  Your physician has recommended you make the following change in your medication:  START Rosuvastatin  (Crestor ) 5 mg daily  *If you need a refill on your cardiac medications before your next appointment, please call your pharmacy*  Lab Work: Your physician recommends that you return for lab work in: 3 months for FASTING lipid profile  If you have any lab test that is abnormal or we need to change your treatment, we will call you to review the results.  Testing/Procedures:                           Mercedes Khan- Long Term Monitor Instructions  Your physician has requested you wear a ZIO patch monitor for 14 days.  This is a single patch monitor. [employer] supplies one patch monitor per enrollment. Additional stickers are not available. Please do not apply patch if you will be having a Nuclear Stress Test,  Echocardiogram, Cardiac CT, MRI, or Chest Xray during the period you would be wearing the  monitor. The patch cannot be worn during these tests. You cannot remove and re-apply the  ZIO XT patch monitor.  Your ZIO patch monitor will be mailed 3 day USPS to your address on file. It may take 3-5 days  to receive your monitor after you have been enrolled.  Once you have received your monitor, please review the enclosed instructions. Your monitor  has already been registered assigning a specific monitor serial # to you.  Billing and Patient Assistance Program Information  We have supplied [employer] with any of your insurance information on file for billing purposes. [employer] offers a sliding scale Patient Assistance Program for patients that do not have  insurance, or whose insurance does not completely cover the cost of the ZIO monitor.  You must apply for the Patient Assistance Program to qualify for this discounted rate.  To apply, please call [employer] at [phone], select option 4, select option 2, ask to apply for  Patient Assistance Program. [name] will ask your  household income, and how many people  are in your household. They will quote your out-of-pocket cost based on that information.  [employer] will also be able to set up a 27-month, interest-free payment plan if needed.  Applying the monitor   Shave hair from upper left chest.  Hold abrader disc by orange tab. Rub abrader in 40 strokes over the upper left chest as  indicated in your monitor instructions.  Clean area with 4 enclosed alcohol pads. Let dry.  Apply patch as indicated in monitor instructions. Patch will be placed under collarbone on left  side of chest with arrow pointing upward.  Rub patch adhesive wings for 2 minutes. Remove white label marked 1. Remove the white  label marked 2. Rub patch adhesive wings for 2 additional minutes.  While looking in a mirror, press and release button in center of patch. A small green light will  flash 3-4 times. This will be your only indicator that the monitor has been turned on.  Do not shower for the first 24 hours. You may shower after the first 24 hours.  Press the button if you feel a symptom. You will hear a small click. Record Date, Time and  Symptom in the Patient Logbook.  When you are ready to remove the patch, follow instructions on the last 2 pages of Patient  Logbook. Stick patch monitor onto the last page of Patient Logbook.  Place Patient  Logbook in the blue and white box. Use locking tab on box and tape box closed  securely. The blue and white box has prepaid postage on it. Please place it in the mailbox as  soon as possible. Your physician should have your test results approximately 7 days after the  monitor has been mailed back to [facility].  Call [facility] Customer Care at [phone] if you have questions regarding  your ZIO XT patch monitor. Call them immediately if you see an orange light blinking on your  monitor.  If your monitor falls off in less than 4 days, contact our Monitor department at [phone].   If your monitor becomes loose or falls off after 4 days call [employer] at [phone] for  suggestions on securing your monitor    Follow-Up: At [name], you and your health needs are our priority.  As part of our continuing mission to provide you with exceptional heart care, our providers are all part of one team.  This team includes your primary Cardiologist (physician) and Advanced Practice Providers or APPs (Physician Assistants and Nurse Practitioners) who all work together to provide you with the care you need, when you need it.  Your next appointment:   3 month(s)  Provider:   Dr. Kate   We recommend signing up for the patient portal called MyChart.  Sign up information is provided on this After Visit Summary.  MyChart is used to connect with patients for Virtual Visits (Telemedicine).  Patients are able to view lab/test results, encounter notes, upcoming appointments, etc.  Non-urgent messages can be sent to your provider as well.   To learn more about what you can do with MyChart, go to [email or web address].   Thank you for choosing [facility]!!   [phone]   Other Instructions   Check your blood  pressure twice daily for the next week and call the office with the results

## 2024-03-21 NOTE — Progress Notes (Unsigned)
 Enrolled patient for a 14 day Zio XT  monitor to be mailed to patients home

## 2024-04-13 DIAGNOSIS — G459 Transient cerebral ischemic attack, unspecified: Secondary | ICD-10-CM | POA: Diagnosis not present

## 2024-04-16 ENCOUNTER — Telehealth: Payer: Self-pay | Admitting: *Deleted

## 2024-04-16 NOTE — Telephone Encounter (Signed)
 Called and spoke to patient and made her aware that Dr. Kate would like to see her tomorrow for office visit 04/17/24 @10 :40 am. Patient verbalized an understanding.

## 2024-04-17 ENCOUNTER — Ambulatory Visit: Attending: Cardiology | Admitting: Cardiology

## 2024-04-17 ENCOUNTER — Other Ambulatory Visit (HOSPITAL_COMMUNITY): Payer: Self-pay

## 2024-04-17 ENCOUNTER — Encounter: Payer: Self-pay | Admitting: Cardiology

## 2024-04-17 VITALS — BP 174/88 | HR 72 | Ht 62.0 in | Wt 169.2 lb

## 2024-04-17 DIAGNOSIS — I350 Nonrheumatic aortic (valve) stenosis: Secondary | ICD-10-CM | POA: Diagnosis not present

## 2024-04-17 DIAGNOSIS — E785 Hyperlipidemia, unspecified: Secondary | ICD-10-CM

## 2024-04-17 DIAGNOSIS — I10 Essential (primary) hypertension: Secondary | ICD-10-CM

## 2024-04-17 DIAGNOSIS — G459 Transient cerebral ischemic attack, unspecified: Secondary | ICD-10-CM | POA: Diagnosis not present

## 2024-04-17 DIAGNOSIS — G4733 Obstructive sleep apnea (adult) (pediatric): Secondary | ICD-10-CM | POA: Diagnosis not present

## 2024-04-17 DIAGNOSIS — I480 Paroxysmal atrial fibrillation: Secondary | ICD-10-CM

## 2024-04-17 MED ORDER — CARVEDILOL 6.25 MG PO TABS: 6.2500 mg | ORAL_TABLET | Freq: Two times a day (BID) | ORAL | 3 refills | Status: AC

## 2024-04-17 MED ORDER — ROSUVASTATIN CALCIUM 5 MG PO TABS: 5.0000 mg | ORAL_TABLET | Freq: Every day | ORAL | 3 refills | Status: AC

## 2024-04-17 MED ORDER — APIXABAN 5 MG PO TABS: 5.0000 mg | ORAL_TABLET | Freq: Two times a day (BID) | ORAL | 3 refills | Status: DC

## 2024-04-17 NOTE — Progress Notes (Signed)
 Cardiology Office Note:    Date:  04/17/2024   ID:  Mercedes Khan, DOB 15-Sep-1940, MRN [id number]  PCP:  Valentin Skates, DO  Cardiologist:  None  Electrophysiologist:  None   Referring MD: Valentin Skates, DO   Chief Complaint  Patient presents with   Atrial Fibrillation    History of Present Illness:    Mercedes Khan is a 83 y.o. female with a hx of hypertension, hypothyroidism, prediabetes, OSA, TIA who presents for follow-up.  She was referred by Warren Loud, NP for evaluation of palpitations, initially seen 03/21/2024.  She reports that in May she was walking in her kitchen and then fell to the ground.  Subsequently had slurred speech for few minutes.  She did not seek medical attention at that time but was seen by neurology as outpatient and had brain MRI which did not show evidence of acute CVA.  Felt to likely have TIA.    Echocardiogram 11/2023 showed normal biventricular function, mild biatrial enlargement, moderate to severe mitral annular calcification, mild aortic stenosis.  Zio patch x 14 days showed atrial fibrillation (4% burden) with longest episode lasting 3 hours with average rate 131 bpm, 1 episode of NSVT lasting 7 beats, 3 episodes of SVT with longest lasting 20 beats.  Since last clinic visit, she reports she is doing well.  She denies any palpitations.  Denies any history of bleeding issues.   Past Medical History:  Diagnosis Date   Allergy    Arthritis    Cancer (HCC)    skin   Gallstones    Hypertension    Hypothyroidism    PONV (postoperative nausea and vomiting)    only after tonsillectomy at age 23yrs   Pre-diabetes    Controlled with diet and exercise, last A1c 10-2016 was 8.7   Sleep apnea    use to wear a mask, lost 30 lbs    Past Surgical History:  Procedure Laterality Date   BREAST SURGERY     cyst removed from left breast   CHOLECYSTECTOMY N/A 03/09/2017   Procedure: LAPAROSCOPIC CHOLECYSTECTOMY WITH INTRAOPERATIVE CHOLANGIOGRAM;   Surgeon: Vernetta Berg, MD;  Location: [facility];  Service: General;  Laterality: N/A;   THYROIDECTOMY     On the right side   TONSILLECTOMY     Wisdom teeth extracted      Current Medications: Current Meds  Medication Sig   apixaban  (ELIQUIS ) 5 MG TABS tablet Take 1 tablet (5 mg total) by mouth 2 (two) times daily.   aspirin EC 81 MG tablet Take 81 mg by mouth daily. Swallow whole.   carvedilol  (COREG ) 6.25 MG tablet Take 1 tablet (6.25 mg total) by mouth 2 (two) times daily.   Cholecalciferol (VITAMIN D PO) Take by mouth.   Cyanocobalamin (VITAMIN B 12 PO) Take 1 tablet by mouth daily at 6 (six) AM.   lisinopril (PRINIVIL,ZESTRIL) 30 MG tablet Take 30 mg by mouth daily.   thyroid  (ARMOUR) 30 MG tablet Take 30 mg by mouth daily. (Patient taking differently: Take 30 mg by mouth.)   [DISCONTINUED] rosuvastatin  (CRESTOR ) 5 MG tablet Take 1 tablet (5 mg total) by mouth daily.     Allergies:   Patient has no known allergies.   Social History   Socioeconomic History   Marital status: Legally Separated    Spouse name: Not on file   Number of children: Not on file   Years of education: Not on file   Highest education level: Not on file  Occupational History   Not on file  Tobacco Use   Smoking status: Former    Current packs/day: 0.00    Types: Cigarettes    Quit date: 07/25/1972    Years since quitting: 51.7   Smokeless tobacco: Never   Tobacco comments:    Quit smoking years ago  Vaping Use   Vaping status: Never Used  Substance and Sexual Activity   Alcohol use: Yes    Comment: rarely   Drug use: No   Sexual activity: Not on file  Other Topics Concern   Not on file  Social History Narrative   Not on file   Social Drivers of Health   Financial Resource Strain: Not on file  Food Insecurity: Not on file  Transportation Needs: Not on file  Physical Activity: Not on file  Stress: Not on file  Social Connections: Not on file     Family  History: The patient's family history is not on file.  ROS:   Please see the history of present illness.     All other systems reviewed and are negative.  EKGs/Labs/Other Studies Reviewed:    The following studies were reviewed today:   EKG:   03/21/2024: Normal sinus rhythm, rate 82, no ST abnormalities, QTc 472  Recent Labs: No results found for requested labs within last 365 days.  Recent Lipid Panel No results found for: CHOL, TRIG, HDL, CHOLHDL, VLDL, LDLCALC, LDLDIRECT  Physical Exam:    VS:  BP (!) 174/88   Pulse 72   Ht 5' 2 (1.575 m)   Wt 169 lb 3.2 oz (76.7 kg)   SpO2 98%   BMI 30.95 kg/m     Wt Readings from Last 3 Encounters:  04/17/24 169 lb 3.2 oz (76.7 kg)  03/21/24 168 lb 3.2 oz (76.3 kg)  11/16/23 166 lb (75.3 kg)     GEN:  Well nourished, well developed in no acute distress HEENT: Normal NECK: No JVD; No carotid bruits LYMPHATICS: No lymphadenopathy CARDIAC: RRR, no murmurs, rubs, gallops RESPIRATORY:  Clear to auscultation without rales, wheezing or rhonchi  ABDOMEN: Soft, non-tender, non-distended MUSCULOSKELETAL:  No edema; No deformity  SKIN: Warm and dry NEUROLOGIC:  Alert and oriented x 3 PSYCHIATRIC:  Normal affect   ASSESSMENT:    1. PAF (paroxysmal atrial fibrillation) (HCC)   2. TIA (transient ischemic attack)   3. Aortic valve stenosis, etiology of cardiac valve disease unspecified   4. OSA (obstructive sleep apnea)   5. Essential hypertension   6. Hyperlipidemia, unspecified hyperlipidemia type     PLAN:    Paroxysmal atrial fibrillation: Presented with TIA 11/2023 Zio patch x 14 days showed atrial fibrillation (4% burden) with longest episode lasting 3 hours with average rate 131 bpm, 1 episode of NSVT lasting 7 beats, 3 episodes of SVT with longest lasting 20 beats.  Echocardiogram 11/2023 showed normal biventricular function, mild biatrial enlargement, moderate to severe mitral annular calcification, mild aortic  stenosis.  CHA2DS2-VASc 5 (hypertension, age x 2, TIA) - Start Eliquis  5 mg twice daily.  Check BMET, CBC - Start carvedilol  6.25 mg twice daily - Has prior history of OSA but has been off CPAP.  Recommend sleep study  TIA: Starting Eliquis  as above.  Continue rosuvastatin   Aortic stenosis: Mild on echocardiogram 11/2023.  Will monitor  Hypertension: On lisinopril 30 mg daily.  BP elevated.  Add carvedilol  as above.  Asked to check BP twice daily for next week and let us  know results  Hyperlipidemia:  LDL 111 on 01/2024.  Started rosuvastatin  5 mg daily.  Check fasting lipid panel in 3 months  OSA: used to be on CPAP but off for years.  Will check sleep study  RTC in 3 months   Medication Adjustments/Labs and Tests Ordered: Current medicines are reviewed at length with the patient today.  Concerns regarding medicines are outlined above.  Orders Placed This Encounter  Procedures   Comprehensive metabolic panel with GFR   CBC   Split night study   Meds ordered this encounter  Medications   carvedilol  (COREG ) 6.25 MG tablet    Sig: Take 1 tablet (6.25 mg total) by mouth 2 (two) times daily.    Dispense:  180 tablet    Refill:  3   apixaban  (ELIQUIS ) 5 MG TABS tablet    Sig: Take 1 tablet (5 mg total) by mouth 2 (two) times daily.    Dispense:  60 tablet    Refill:  3   rosuvastatin  (CRESTOR ) 5 MG tablet    Sig: Take 1 tablet (5 mg total) by mouth daily.    Dispense:  90 tablet    Refill:  3    Patient Instructions  Medication Instructions:  Start Eliquis  5mg  twice a day Start Coreg  6.25 mg twice a day  Your physician recommends that you continue on your current medications as directed. Please refer to the Current Medication list given to you today.  *If you need a refill on your cardiac medications before your next appointment, please call your pharmacy*  Lab Work: Cmet, cbc today If you have labs (blood work) drawn today and your tests are completely normal, you will  receive your results only by: MyChart Message (if you have MyChart) OR A paper copy in the mail If you have any lab test that is abnormal or we need to change your treatment, we will call you to review the results.  Testing/Procedures: Split sleep study Your physician has recommended that you have a sleep study. This test records several body functions during sleep, including: brain activity, eye movement, oxygen and carbon dioxide blood levels, heart rate and rhythm, breathing rate and rhythm, the flow of air through your mouth and nose, snoring, body muscle movements, and chest and belly movement.   Follow-Up: At [facility], you and your health needs are our priority.  As part of our continuing mission to provide you with exceptional heart care, our providers are all part of one team.  This team includes your primary Cardiologist (physician) and Advanced Practice Providers or APPs (Physician Assistants and Nurse Practitioners) who all work together to provide you with the care you need, when you need it.  Your next appointment:   Already scheduled  Provider:   Dr. Kate   We recommend signing up for the patient portal called MyChart.  Sign up information is provided on this After Visit Summary.  MyChart is used to connect with patients for Virtual Visits (Telemedicine).  Patients are able to view lab/test results, encounter notes, upcoming appointments, etc.  Non-urgent messages can be sent to your provider as well.   To learn more about what you can do with MyChart, go to [email or web address].   Other Instructions none           Signed, Lonni LITTIE Kate, MD  04/17/2024 12:24 PM    [facility]

## 2024-04-17 NOTE — Patient Instructions (Addendum)
 Medication Instructions:  Start Eliquis  5mg  twice a day Start Coreg  6.25 mg twice a day  Your physician recommends that you continue on your current medications as directed. Please refer to the Current Medication list given to you today.  *If you need a refill on your cardiac medications before your next appointment, please call your pharmacy*  Lab Work: Cmet, cbc today If you have labs (blood work) drawn today and your tests are completely normal, you will receive your results only by: MyChart Message (if you have MyChart) OR A paper copy in the mail If you have any lab test that is abnormal or we need to change your treatment, we will call you to review the results.  Testing/Procedures: Split sleep study Your physician has recommended that you have a sleep study. This test records several body functions during sleep, including: brain activity, eye movement, oxygen and carbon dioxide blood levels, heart rate and rhythm, breathing rate and rhythm, the flow of air through your mouth and nose, snoring, body muscle movements, and chest and belly movement.   Follow-Up: At [facility], you and your health needs are our priority.  As part of our continuing mission to provide you with exceptional heart care, our providers are all part of one team.  This team includes your primary Cardiologist (physician) and Advanced Practice Providers or APPs (Physician Assistants and Nurse Practitioners) who all work together to provide you with the care you need, when you need it.  Your next appointment:   Already scheduled  Provider:   Dr. Kate   We recommend signing up for the patient portal called MyChart.  Sign up information is provided on this After Visit Summary.  MyChart is used to connect with patients for Virtual Visits (Telemedicine).  Patients are able to view lab/test results, encounter notes, upcoming appointments, etc.  Non-urgent messages can be sent to your provider as well.    To learn more about what you can do with MyChart, go to [email or web address].   Other Instructions none

## 2024-04-18 ENCOUNTER — Ambulatory Visit: Payer: Self-pay | Admitting: Cardiology

## 2024-04-18 DIAGNOSIS — I10 Essential (primary) hypertension: Secondary | ICD-10-CM

## 2024-04-18 LAB — CBC
Hematocrit: 43.3 % (ref 34.0–46.6)
Hemoglobin: 13.7 g/dL (ref 11.1–15.9)
MCH: 29.9 pg (ref 26.6–33.0)
MCHC: 31.6 g/dL (ref 31.5–35.7)
MCV: 95 fL (ref 79–97)
Platelets: 244 x10E3/uL (ref 150–450)
RBC: 4.58 x10E6/uL (ref 3.77–5.28)
RDW: 13.3 % (ref 11.7–15.4)
WBC: 8.9 x10E3/uL (ref 3.4–10.8)

## 2024-04-18 LAB — COMPREHENSIVE METABOLIC PANEL WITH GFR
ALT: 10 IU/L (ref 0–32)
AST: 16 IU/L (ref 0–40)
Albumin: 4.5 g/dL (ref 3.7–4.7)
Alkaline Phosphatase: 83 IU/L (ref 48–129)
BUN/Creatinine Ratio: 31 — AB (ref 12–28)
BUN: 18 mg/dL (ref 8–27)
Bilirubin Total: 0.2 mg/dL (ref 0.0–1.2)
CO2: 23 mmol/L (ref 20–29)
Calcium: 9.5 mg/dL (ref 8.7–10.3)
Chloride: 106 mmol/L (ref 96–106)
Creatinine, Ser: 0.59 mg/dL (ref 0.57–1.00)
Globulin, Total: 2.2 g/dL (ref 1.5–4.5)
Glucose: 128 mg/dL — AB (ref 70–99)
Potassium: 4.6 mmol/L (ref 3.5–5.2)
Sodium: 145 mmol/L — AB (ref 134–144)
Total Protein: 6.7 g/dL (ref 6.0–8.5)
eGFR: 89 mL/min/1.73 (ref >59)

## 2024-04-29 ENCOUNTER — Telehealth: Payer: Self-pay | Admitting: Cardiology

## 2024-04-29 ENCOUNTER — Other Ambulatory Visit (HOSPITAL_COMMUNITY): Payer: Self-pay

## 2024-04-29 NOTE — Telephone Encounter (Signed)
 Caller Careers information officer) stated she is following-up on Dr. Alvan referral for patient to have sleep study.

## 2024-04-30 ENCOUNTER — Other Ambulatory Visit (HOSPITAL_COMMUNITY): Payer: Self-pay

## 2024-04-30 DIAGNOSIS — E870 Hyperosmolality and hypernatremia: Secondary | ICD-10-CM | POA: Diagnosis not present

## 2024-04-30 DIAGNOSIS — I739 Peripheral vascular disease, unspecified: Secondary | ICD-10-CM | POA: Diagnosis not present

## 2024-04-30 DIAGNOSIS — I4891 Unspecified atrial fibrillation: Secondary | ICD-10-CM | POA: Diagnosis not present

## 2024-04-30 DIAGNOSIS — E039 Hypothyroidism, unspecified: Secondary | ICD-10-CM | POA: Diagnosis not present

## 2024-04-30 DIAGNOSIS — I10 Essential (primary) hypertension: Secondary | ICD-10-CM | POA: Diagnosis not present

## 2024-04-30 DIAGNOSIS — E1169 Type 2 diabetes mellitus with other specified complication: Secondary | ICD-10-CM | POA: Diagnosis not present

## 2024-04-30 DIAGNOSIS — G720 Drug-induced myopathy: Secondary | ICD-10-CM | POA: Diagnosis not present

## 2024-04-30 DIAGNOSIS — C439 Malignant melanoma of skin, unspecified: Secondary | ICD-10-CM | POA: Diagnosis not present

## 2024-04-30 LAB — LAB REPORT - SCANNED
A1c: 6.7
EGFR: 95.5

## 2024-04-30 MED ORDER — OZEMPIC (0.25 OR 0.5 MG/DOSE) 2 MG/3ML ~~LOC~~ SOPN
0.5000 mg | PEN_INJECTOR | SUBCUTANEOUS | 3 refills | Status: AC
  Filled 2024-04-30: qty 3, 28d supply, fill #0
  Filled 2024-06-03: qty 3, 28d supply, fill #1
  Filled 2024-06-27 (×2): qty 3, 28d supply, fill #2
  Filled 2024-07-15: qty 3, 28d supply, fill #3

## 2024-05-01 NOTE — Telephone Encounter (Signed)
**Note De-Identified Lynnette Pote Obfuscation** Late entry from 04/30/2024: I called Shay back and determined that there is no DPR on file that the pt gives permission for us  to s/w her on his behalf.  She stated that she is the pts daughter in law and that she understands that if there is no DPR on file that I cannot s/w her.  I did advise her that I would be sending a MYCHART message to the pt and that I need her to see it and to answer the questions that I sent. Claris stated that she would s/w the pt and her husband who is a DPR for the pt and that one of them would address the [facility] Message and will send it back to me with the questions answered.  She did thank me for calling her back.

## 2024-05-02 ENCOUNTER — Other Ambulatory Visit (HOSPITAL_COMMUNITY): Payer: Self-pay

## 2024-05-03 ENCOUNTER — Telehealth: Payer: Self-pay

## 2024-05-03 DIAGNOSIS — G4733 Obstructive sleep apnea (adult) (pediatric): Secondary | ICD-10-CM

## 2024-05-03 DIAGNOSIS — I480 Paroxysmal atrial fibrillation: Secondary | ICD-10-CM

## 2024-05-03 DIAGNOSIS — I10 Essential (primary) hypertension: Secondary | ICD-10-CM

## 2024-05-03 NOTE — Telephone Encounter (Signed)
**Note De-Identified Marli Diego Obfuscation** I attempted to do a Split Night Sleep Study PA through the [employer] provider portal and it stated that the PA was submitted but it did not give me a Auth # so I called [employer] and could not s/w a person so I did a Split Night Sleep Study PA with the virtual assistant.  Call Reference #:  [id number]  Auth #: [id number]

## 2024-05-05 ENCOUNTER — Ambulatory Visit: Payer: Self-pay | Admitting: Cardiology

## 2024-05-06 NOTE — Telephone Encounter (Signed)
**Note De-Identified Lorianna Spadaccini Obfuscation** I called [employer] to check the status of this Split Night Sleep Study PA and was advised by Camie that it is still pending and that it has been updated to nurse review at this time. I did provide our fax number and requested that they fax us  their determination and Camie stated that they will.  Auth #: [id number]

## 2024-05-09 NOTE — Telephone Encounter (Signed)
**Note De-Identified Vaidehi Braddy Obfuscation** I called [employer] to scheck the status of this Split Night Sleep Study and was advised by Bari L that the PA was denied as the pt does not meet criteria for an in lab sleep study. Per [place], no PA is required for a Home Sleep Study Type 3 (provided to pt by the sleep lab) or a [employer]-HST (Will be provided Lerline Valdivia the mail from the [employer]).  Forwarding this message to Dr Kate.

## 2024-05-09 NOTE — Telephone Encounter (Signed)
Can we do a home sleep study?

## 2024-05-10 NOTE — Telephone Encounter (Signed)
**Note De-Identified Mercedes Khan Obfuscation** The pt and her son called me back and after we discussed the 2 different types of Home Sleep Studies that are available, they advised me that the pt wants to do the WatchPAT One-HST.  They are aware that the [employer] will be mailing a device to the pt and will stay in touch with her [name] telephone calls until she has completed her home sleep study successfully.  Both the pt and her son verbalized understanding and thanked me for my call.  I have placed the Itamar-HST order.

## 2024-05-10 NOTE — Telephone Encounter (Signed)
**Note De-Identified Arieonna Medine Obfuscation** I called the pt and she is in agreement with doing a home sleep study. She is aware that if she does the Home Sleep Study Type 3 that the device will be provided to her by the [facility] and that if she does the [employer]amar-HST that we can either provide one to her or she can wait a couple of weeks and the [employer] will mail her a device.  She states that she will discuss with her son and will call us  back to let us  know which Home Sleep Test she wants to do.

## 2024-05-13 ENCOUNTER — Ambulatory Visit: Payer: Self-pay | Admitting: Cardiology

## 2024-05-13 DIAGNOSIS — G459 Transient cerebral ischemic attack, unspecified: Secondary | ICD-10-CM

## 2024-06-03 ENCOUNTER — Encounter (HOSPITAL_BASED_OUTPATIENT_CLINIC_OR_DEPARTMENT_OTHER): Payer: Self-pay | Admitting: Cardiology

## 2024-06-03 DIAGNOSIS — G4734 Idiopathic sleep related nonobstructive alveolar hypoventilation: Secondary | ICD-10-CM | POA: Diagnosis not present

## 2024-06-03 DIAGNOSIS — R030 Elevated blood-pressure reading, without diagnosis of hypertension: Secondary | ICD-10-CM | POA: Diagnosis not present

## 2024-06-03 DIAGNOSIS — G4730 Sleep apnea, unspecified: Secondary | ICD-10-CM | POA: Diagnosis not present

## 2024-06-03 DIAGNOSIS — G4733 Obstructive sleep apnea (adult) (pediatric): Secondary | ICD-10-CM

## 2024-06-03 DIAGNOSIS — R0683 Snoring: Secondary | ICD-10-CM | POA: Diagnosis not present

## 2024-06-04 ENCOUNTER — Ambulatory Visit: Attending: Cardiology

## 2024-06-04 DIAGNOSIS — G4733 Obstructive sleep apnea (adult) (pediatric): Secondary | ICD-10-CM

## 2024-06-04 DIAGNOSIS — I480 Paroxysmal atrial fibrillation: Secondary | ICD-10-CM

## 2024-06-04 DIAGNOSIS — I10 Essential (primary) hypertension: Secondary | ICD-10-CM

## 2024-06-04 NOTE — Procedures (Signed)
   SLEEP STUDY REPORT Patient Information Study Date: 06/03/2024 Patient Name: Mercedes Khan Patient ID: [id number] Birth Date: 1941-06-21 Age: 83 Gender: Female BMI: 31.2 (W=170 lb, H=5' 2'') Referring Physician: Lonni Nanas, MD  TEST DESCRIPTION: Home sleep apnea testing was completed using the WatchPat, a Type 1 device, utilizing peripheral arterial tonometry (PAT), chest movement, actigraphy, pulse oximetry, pulse rate, body position and snore. AHI was calculated with apnea and hypopnea using valid sleep time as the denominator. RDI includes apneas, hypopneas, and RERAs. The data acquired and the scoring of sleep and all associated events were performed in accordance with the recommended standards and specifications as outlined in the AASM Manual for the Scoring of Sleep and Associated Events 2.2.0 (2015).  FINDINGS: 1. No evidence of Obstructive Sleep Apnea with AHI 3.1/hr. 2. No Central Sleep Apnea. 3. Oxygen desaturations as low as 75%. 4. Moderate to severe snoring was present. O2 sats were < 88% for 10.4 minutes. 5. Total sleep time was 8 hrs and 23 min. 6. 23.4% of total sleep time was spent in REM sleep. 7. Normal sleep onset latency at 15 min. 8. Prolonged REM sleep onset latency at 185 min. 9. Total awakenings were 3.  DIAGNOSIS: No significant sleep disordered breathing. Nocturnal Hypoxemia  RECOMMENDATIONS: 1. Normal study with no significant sleep disordered breathing. 2. Healthy sleep recommendations include: adequate nightly sleep (normal 7-9 hrs/night), avoidance of caffeine after noon and alcohol near bedtime, and maintaining a sleep environment that is cool, dark and quiet. 3. Weight loss for overweight patients is recommended. 4. Snoring recommendations include: weight loss where appropriate, side sleeping, and avoidance of alcohol before bed. 5. Operation of motor vehicle or dangerous equipment must be avoided when feeling drowsy, excessively  sleepy, or mentally fatigued. 6. An ENT consultation which may be useful for specific causes of and possible treatment of bothersome snoring . 7. Weight loss may be of benefit in reducing the severity of snoring.   Signature: Wilbert Bihari, MD; [facility]; Diplomat, American Board of Sleep Medicine Electronically Signed: 06/04/2024 6:23:12 PM

## 2024-06-18 DIAGNOSIS — H40013 Open angle with borderline findings, low risk, bilateral: Secondary | ICD-10-CM | POA: Diagnosis not present

## 2024-06-23 NOTE — Progress Notes (Unsigned)
 Cardiology Office Note:    Date:  06/25/2024   ID:  Mercedes Khan, DOB [birth year], MRN [id number]  PCP:  Valentin Skates, DO  Cardiologist:  None  Electrophysiologist:  None   Referring MD: Valentin Skates, DO   No chief complaint on file.   History of Present Illness:    Mercedes Khan is a 83 y.o. female with a hx of hypertension, hypothyroidism, prediabetes, OSA, TIA who presents for follow-up.  She was referred by Warren Loud, NP for evaluation of palpitations, initially seen 03/21/2024.  She reports that in May she was walking in her kitchen and then fell to the ground.  Subsequently had slurred speech for few minutes.  She did not seek medical attention at that time but was seen by neurology as outpatient and had brain MRI which did not show evidence of acute CVA.  Felt to likely have TIA.    Echocardiogram 11/2023 showed normal biventricular function, mild biatrial enlargement, moderate to severe mitral annular calcification, mild aortic stenosis.  Zio patch x 14 days showed atrial fibrillation (4% burden) with longest episode lasting 3 hours with average rate 131 bpm, 1 episode of NSVT lasting 7 beats, 3 episodes of SVT with longest lasting 20 beats.  Since last clinic visit, she reports that she has not felt well today.  States that she awoke feeling lightheaded and felt like she was going to pass out.  Continues to feel poorly.  Reports checked her BP and was normal.  Denies any chest pain or dyspnea or palpitations.  She has been taking Eliquis , denies any bleeding issues.   Past Medical History:  Diagnosis Date   Allergy    Arthritis    Cancer (HCC)    skin   Gallstones    Hypertension    Hypothyroidism    PONV (postoperative nausea and vomiting)    only after tonsillectomy at age 18yrs   Pre-diabetes    Controlled with diet and exercise, last A1c 10-2016 was 8.7   Sleep apnea    use to wear a mask, lost 30 lbs    Past Surgical History:  Procedure Laterality Date    BREAST SURGERY     cyst removed from left breast   CHOLECYSTECTOMY N/A 03/09/2017   Procedure: LAPAROSCOPIC CHOLECYSTECTOMY WITH INTRAOPERATIVE CHOLANGIOGRAM;  Surgeon: Vernetta Berg, MD;  Location: [facility];  Service: General;  Laterality: N/A;   THYROIDECTOMY     On the right side   TONSILLECTOMY     Wisdom teeth extracted      Current Medications: Current Meds  Medication Sig   apixaban  (ELIQUIS ) 5 MG TABS tablet Take 1 tablet (5 mg total) by mouth 2 (two) times daily.   carvedilol  (COREG ) 6.25 MG tablet Take 1 tablet (6.25 mg total) by mouth 2 (two) times daily.   Cholecalciferol (VITAMIN D PO) Take by mouth.   Cyanocobalamin (VITAMIN B 12 PO) Take 1 tablet by mouth daily at 6 (six) AM.   lisinopril (PRINIVIL,ZESTRIL) 30 MG tablet Take 30 mg by mouth daily.   rosuvastatin  (CRESTOR ) 5 MG tablet Take 1 tablet (5 mg total) by mouth daily.   Semaglutide ,0.25 or 0.5MG /DOS, (OZEMPIC , 0.25 OR 0.5 MG/DOSE,) 2 MG/3ML SOPN Inject 0.5 mg into the skin once a week as directed.   thyroid  (ARMOUR) 30 MG tablet Take 30 mg by mouth daily. (Patient taking differently: Take 30 mg by mouth.)     Allergies:   Patient has no known allergies.   Social History  Socioeconomic History   Marital status: Legally Separated    Spouse name: Not on file   Number of children: Not on file   Years of education: Not on file   Highest education level: Not on file  Occupational History   Not on file  Tobacco Use   Smoking status: Former    Current packs/day: 0.00    Types: Cigarettes    Quit date: 07/25/1972    Years since quitting: 51.9   Smokeless tobacco: Never   Tobacco comments:    Quit smoking years ago  Vaping Use   Vaping status: Never Used  Substance and Sexual Activity   Alcohol use: Yes    Comment: rarely   Drug use: No   Sexual activity: Not on file  Other Topics Concern   Not on file  Social History Narrative   Not on file   Social Drivers of Health    Financial Resource Strain: Not on file  Food Insecurity: Not on file  Transportation Needs: Not on file  Physical Activity: Not on file  Stress: Not on file  Social Connections: Not on file     Family History: The patient's family history is not on file.  ROS:   Please see the history of present illness.     All other systems reviewed and are negative.  EKGs/Labs/Other Studies Reviewed:    The following studies were reviewed today:   EKG:   03/21/2024: Normal sinus rhythm, rate 82, no ST abnormalities, QTc 472 06/25/24: Afib with RVR, rate 148  Recent Labs: 04/17/2024: ALT 10; BUN 18; Creatinine, Ser 0.59; Hemoglobin 13.7; Platelets 244; Potassium 4.6; Sodium 145  Recent Lipid Panel No results found for: CHOL, TRIG, HDL, CHOLHDL, VLDL, LDLCALC, LDLDIRECT  Physical Exam:    VS:  BP 124/82 (BP Location: Left Arm, Patient Position: Sitting)   Pulse 74   Ht 5' 2 (1.575 m)   Wt 159 lb 12.8 oz (72.5 kg)   SpO2 96%   BMI 29.23 kg/m     Wt Readings from Last 3 Encounters:  06/25/24 159 lb 12.8 oz (72.5 kg)  04/17/24 169 lb 3.2 oz (76.7 kg)  03/21/24 168 lb 3.2 oz (76.3 kg)     GEN:  Well nourished, well developed in no acute distress HEENT: Normal NECK: No JVD; No carotid bruits LYMPHATICS: No lymphadenopathy CARDIAC: RRR, no murmurs, rubs, gallops RESPIRATORY:  Clear to auscultation without rales, wheezing or rhonchi  ABDOMEN: Soft, non-tender, non-distended MUSCULOSKELETAL:  No edema; No deformity  SKIN: Warm and dry NEUROLOGIC:  Alert and oriented x 3 PSYCHIATRIC:  Normal affect   ASSESSMENT:    1. Atrial fibrillation with RVR (HCC)   2. TIA (transient ischemic attack)   3. Aortic valve stenosis, etiology of cardiac valve disease unspecified   4. Essential hypertension   5. Hyperlipidemia, unspecified hyperlipidemia type      PLAN:    Atrial fibrillation: Presented with TIA 11/2023. Zio patch x 14 days showed atrial fibrillation (4%  burden) with longest episode lasting 3 hours with average rate 131 bpm, 1 episode of NSVT lasting 7 beats, 3 episodes of SVT with longest lasting 20 beats.  Echocardiogram 11/2023 showed normal biventricular function, mild biatrial enlargement, moderate to severe mitral annular calcification, mild aortic stenosis.  CHA2DS2-VASc 5 (hypertension, age x 2, TIA) - Continue Eliquis  5 mg twice daily.  She denies any missed doses - Continue carvedilol  - She reports she awoke feeling poorly today, felt like she is going to  pass out.  She is in rapid A-fib, heart rate 150s in clinic today.  Suspect her symptoms are coming from A-fib and and given symptoms have not abated all day, recommend going to ED for cardioversion.  She denies any missed doses of Eliquis  over the last 3 weeks, would plan for cardioversion in ED today.  Will schedule follow-up in A-fib clinic to consider antiarrhythmic versus ablation moving forward  TIA: Continue Eliquis  as above.  Continue rosuvastatin   Aortic stenosis: Mild on echocardiogram 11/2023.  Will monitor  Hypertension: On lisinopril 30 mg daily and carvedilol  6.25 mg twice daily  Hyperlipidemia: LDL 111 on 01/2024.  Started rosuvastatin  5 mg daily.  LDL 54 04/2024  OSA: used to be on CPAP but off for years.  Sleep study 05/2024 showed no evidence of OSA but did show nocturnal hypoxemia  RTC in 3 months   Medication Adjustments/Labs and Tests Ordered: Current medicines are reviewed at length with the patient today.  Concerns regarding medicines are outlined above.  Orders Placed This Encounter  Procedures   Amb Referral to AFIB Clinic   EKG 12-Lead   EKG 12-Lead   No orders of the defined types were placed in this encounter.   Patient Instructions  Medication Instructions:  Your physician recommends that you continue on your current medications as directed. Please refer to the Current Medication list given to you today.   Please go to Emergency Room *If you need  a refill on your cardiac medications before your next appointment, please call your pharmacy*  Lab Work: none If you have labs (blood work) drawn today and your tests are completely normal, you will receive your results only by: MyChart Message (if you have MyChart) OR A paper copy in the mail If you have any lab test that is abnormal or we need to change your treatment, we will call you to review the results.  Testing/Procedures: Please go to Emergency Room as discussed with your provider  Follow-Up: At [facility], you and your health needs are our priority.  As part of our continuing mission to provide you with exceptional heart care, our providers are all part of one team.  This team includes your primary Cardiologist (physician) and Advanced Practice Providers or APPs (Physician Assistants and Nurse Practitioners) who all work together to provide you with the care you need, when you need it.  Your next appointment:   3 months  Provider:   Dr. Kate  We recommend signing up for the patient portal called MyChart.  Sign up information is provided on this After Visit Summary.  MyChart is used to connect with patients for Virtual Visits (Telemedicine).  Patients are able to view lab/test results, encounter notes, upcoming appointments, etc.  Non-urgent messages can be sent to your provider as well.   To learn more about what you can do with MyChart, go to [email or web address].   Other Instructions Referral to A fib to be schedule in 2 weeks           Signed, Lonni LITTIE Kate, MD  06/25/2024 12:32 PM    [facility]

## 2024-06-25 ENCOUNTER — Ambulatory Visit: Attending: Cardiology | Admitting: Cardiology

## 2024-06-25 ENCOUNTER — Emergency Department (HOSPITAL_COMMUNITY)
Admission: EM | Admit: 2024-06-25 | Discharge: 2024-06-25 | Disposition: A | Discharge status: Home / Self Care | Attending: Emergency Medicine | Admitting: Emergency Medicine

## 2024-06-25 ENCOUNTER — Other Ambulatory Visit: Payer: Self-pay

## 2024-06-25 ENCOUNTER — Encounter: Payer: Self-pay | Admitting: Cardiology

## 2024-06-25 VITALS — BP 124/82 | HR 74 | Ht 62.0 in | Wt 159.8 lb

## 2024-06-25 DIAGNOSIS — I4891 Unspecified atrial fibrillation: Secondary | ICD-10-CM

## 2024-06-25 DIAGNOSIS — I350 Nonrheumatic aortic (valve) stenosis: Secondary | ICD-10-CM

## 2024-06-25 DIAGNOSIS — E785 Hyperlipidemia, unspecified: Secondary | ICD-10-CM

## 2024-06-25 DIAGNOSIS — I10 Essential (primary) hypertension: Secondary | ICD-10-CM

## 2024-06-25 DIAGNOSIS — Z7901 Long term (current) use of anticoagulants: Secondary | ICD-10-CM | POA: Diagnosis not present

## 2024-06-25 DIAGNOSIS — Z79899 Other long term (current) drug therapy: Secondary | ICD-10-CM | POA: Insufficient documentation

## 2024-06-25 DIAGNOSIS — R002 Palpitations: Secondary | ICD-10-CM | POA: Diagnosis not present

## 2024-06-25 DIAGNOSIS — G459 Transient cerebral ischemic attack, unspecified: Secondary | ICD-10-CM

## 2024-06-25 LAB — COMPREHENSIVE METABOLIC PANEL WITH GFR
ALT: 14 U/L (ref 0–44)
AST: 20 U/L (ref 15–41)
Albumin: 3.7 g/dL (ref 3.5–5.0)
Alkaline Phosphatase: 59 U/L (ref 38–126)
Anion gap: 10 (ref 5–15)
BUN: 20 mg/dL (ref 8–23)
CO2: 22 mmol/L (ref 22–32)
Calcium: 9.7 mg/dL (ref 8.9–10.3)
Chloride: 106 mmol/L (ref 98–111)
Creatinine, Ser: 0.89 mg/dL (ref 0.44–1.00)
GFR, Estimated: >60 mL/min (ref >60)
Glucose, Bld: 159 mg/dL — ABNORMAL HIGH (ref 70–99)
Potassium: 4 mmol/L (ref 3.5–5.1)
Sodium: 138 mmol/L (ref 135–145)
Total Bilirubin: 0.7 mg/dL (ref 0.0–1.2)
Total Protein: 6.5 g/dL (ref 6.5–8.1)

## 2024-06-25 LAB — CBC WITH DIFFERENTIAL/PLATELET
Abs Immature Granulocytes: 0.05 K/uL (ref 0.00–0.07)
Basophils Absolute: 0.1 K/uL (ref 0.0–0.1)
Basophils Relative: 1 %
Eosinophils Absolute: 0.1 K/uL (ref 0.0–0.5)
Eosinophils Relative: 1 %
HCT: 43.6 % (ref 36.0–46.0)
Hemoglobin: 14.4 g/dL (ref 12.0–15.0)
Immature Granulocytes: 1 %
Lymphocytes Relative: 15 %
Lymphs Abs: 1.5 K/uL (ref 0.7–4.0)
MCH: 29.8 pg (ref 26.0–34.0)
MCHC: 33 g/dL (ref 30.0–36.0)
MCV: 90.1 fL (ref 80.0–100.0)
Monocytes Absolute: 0.8 K/uL (ref 0.1–1.0)
Monocytes Relative: 8 %
Neutro Abs: 7.3 K/uL (ref 1.7–7.7)
Neutrophils Relative %: 74 %
Platelets: 247 K/uL (ref 150–400)
RBC: 4.84 MIL/uL (ref 3.87–5.11)
RDW: 13.4 % (ref 11.5–15.5)
WBC: 9.7 K/uL (ref 4.0–10.5)
nRBC: 0 % (ref 0.0–0.2)

## 2024-06-25 LAB — TSH: TSH: 0.928 u[IU]/mL (ref 0.350–4.500)

## 2024-06-25 LAB — TROPONIN I (HIGH SENSITIVITY): Troponin I (High Sensitivity): 12 ng/L (ref <18)

## 2024-06-25 LAB — MAGNESIUM: Magnesium: 1.8 mg/dL (ref 1.7–2.4)

## 2024-06-25 MED ORDER — SODIUM CHLORIDE 0.9 % IV BOLUS
1000.0000 mL | Freq: Once | INTRAVENOUS | Status: AC
  Administered 2024-06-25: 1000 mL via INTRAVENOUS

## 2024-06-25 MED ORDER — SODIUM CHLORIDE 0.9 % IV BOLUS: 500.0000 mL | Freq: Once | INTRAVENOUS | Status: DC

## 2024-06-25 NOTE — ED Triage Notes (Signed)
 BIB family from home by way of cardiology appt in office. EKG showed afib RVR, sent to ED. New onset afib. Felt palpitations and heart pounding PTA, sx resolved. Denies any sx at this time. Denies pain, sob, NV, dizziness, light headedness or other sx. Pt alert, NAD, calm, interactive, resps e/u, speaking in clear complete sentences, steady gait with rolling walker, skin W&D. Radial pulse fast and irregular.

## 2024-06-25 NOTE — ED Notes (Signed)
Sent for cardioversion

## 2024-06-25 NOTE — Patient Instructions (Addendum)
 Medication Instructions:  Your physician recommends that you continue on your current medications as directed. Please refer to the Current Medication list given to you today.   Please go to Emergency Room *If you need a refill on your cardiac medications before your next appointment, please call your pharmacy*  Lab Work: none If you have labs (blood work) drawn today and your tests are completely normal, you will receive your results only by: MyChart Message (if you have MyChart) OR A paper copy in the mail If you have any lab test that is abnormal or we need to change your treatment, we will call you to review the results.  Testing/Procedures: Please go to Emergency Room as discussed with your provider  Follow-Up: At [facility], you and your health needs are our priority.  As part of our continuing mission to provide you with exceptional heart care, our providers are all part of one team.  This team includes your primary Cardiologist (physician) and Advanced Practice Providers or APPs (Physician Assistants and Nurse Practitioners) who all work together to provide you with the care you need, when you need it.  Your next appointment:   3 months  Provider:   Dr. Kate  We recommend signing up for the patient portal called MyChart.  Sign up information is provided on this After Visit Summary.  MyChart is used to connect with patients for Virtual Visits (Telemedicine).  Patients are able to view lab/test results, encounter notes, upcoming appointments, etc.  Non-urgent messages can be sent to your provider as well.   To learn more about what you can do with MyChart, go to [email or web address].   Other Instructions Referral to A fib to be schedule in 2 weeks

## 2024-06-25 NOTE — ED Provider Notes (Signed)
 [place] EMERGENCY DEPARTMENT AT [facility] Provider Note   CSN: [id number] Arrival date & time: 06/25/24  1246     Patient presents with: Palpitations   Mercedes Khan is a 83 y.o. female.   The history is provided by the patient and medical records. No language interpreter was used.  Palpitations Palpitations quality:  Irregular Onset quality:  Gradual Duration:  1 day Timing:  Intermittent Progression:  Waxing and waning Chronicity:  Recurrent Relieved by:  Nothing Worsened by:  Nothing Ineffective treatments:  None tried Associated symptoms: malaise/fatigue   Associated symptoms: no back pain, no chest pain, no chest pressure, no cough, no diaphoresis, no dizziness, no leg pain, no lower extremity edema, no nausea, no near-syncope, no numbness, no shortness of breath, no syncope, no vomiting and no weakness   Risk factors: hx of atrial fibrillation        Prior to Admission medications   Medication Sig Start Date End Date Taking? Authorizing Provider  apixaban  (ELIQUIS ) 5 MG TABS tablet Take 1 tablet (5 mg total) by mouth 2 (two) times daily. 04/17/24   Mercedes Lonni CROME, MD  aspirin EC 81 MG tablet Take 81 mg by mouth daily. Swallow whole. Patient not taking: Reported on 06/25/2024    [provider]  carvedilol  (COREG ) 6.25 MG tablet Take 1 tablet (6.25 mg total) by mouth 2 (two) times daily. 04/17/24   Mercedes Lonni CROME, MD  Cholecalciferol (VITAMIN D PO) Take by mouth.    [provider]  Cyanocobalamin (VITAMIN B 12 PO) Take 1 tablet by mouth daily at 6 (six) AM.    [provider]  lisinopril (PRINIVIL,ZESTRIL) 30 MG tablet Take 30 mg by mouth daily.    [provider]  rosuvastatin  (CRESTOR ) 5 MG tablet Take 1 tablet (5 mg total) by mouth daily. 04/17/24   Mercedes Lonni CROME, MD  Semaglutide ,0.25 or 0.5MG /DOS, (OZEMPIC , 0.25 OR 0.5 MG/DOSE,) 2 MG/3ML SOPN Inject 0.5 mg into the skin once a week as  directed. 04/30/24     thyroid  (ARMOUR) 30 MG tablet Take 30 mg by mouth daily. Patient taking differently: Take 30 mg by mouth.    [provider]    Allergies: Patient has no known allergies.    Review of Systems  Constitutional:  Positive for fatigue and malaise/fatigue. Negative for chills, diaphoresis and fever.  HENT:  Negative for congestion.   Eyes:  Negative for visual disturbance.  Respiratory:  Negative for cough, chest tightness and shortness of breath.   Cardiovascular:  Positive for palpitations. Negative for chest pain, syncope and near-syncope.  Gastrointestinal:  Negative for abdominal pain, constipation, diarrhea, nausea and vomiting.  Genitourinary:  Negative for dysuria and flank pain.  Musculoskeletal:  Negative for back pain and neck pain.  Skin:  Negative for rash.  Neurological:  Positive for light-headedness. Negative for dizziness, syncope, facial asymmetry, speech difficulty, weakness, numbness and headaches.  Psychiatric/Behavioral:  Negative for agitation.   All other systems reviewed and are negative.   Updated Vital Signs BP (!) 172/84 (BP Location: Right Arm)   Pulse 90   Temp 98.4 F (36.9 C)   Resp 18   SpO2 96%   Physical Exam Vitals and nursing note reviewed.  Constitutional:      General: She is not in acute distress.    Appearance: She is well-developed. She is not ill-appearing, toxic-appearing or diaphoretic.  HENT:     Head: Normocephalic and atraumatic.     Right Ear: External  ear normal.     Left Ear: External ear normal.     Nose: Nose normal.     Mouth/Throat:     Pharynx: No oropharyngeal exudate or posterior oropharyngeal erythema.  Eyes:     Extraocular Movements: Extraocular movements intact.     Conjunctiva/sclera: Conjunctivae normal.     Pupils: Pupils are equal, round, and reactive to light.  Cardiovascular:     Rate and Rhythm: Normal rate. Rhythm irregular.     Pulses: Normal pulses.  Pulmonary:      Effort: No respiratory distress.     Breath sounds: No stridor. No wheezing or rhonchi.  Chest:     Chest wall: No tenderness.  Abdominal:     General: There is no distension.     Tenderness: There is no abdominal tenderness. There is no guarding or rebound.  Musculoskeletal:        General: No tenderness.     Cervical back: Normal range of motion and neck supple. No tenderness.     Right lower leg: No edema.     Left lower leg: No edema.  Skin:    General: Skin is warm.     Findings: No erythema or rash.  Neurological:     General: No focal deficit present.     Mental Status: She is alert and oriented to person, place, and time.     Sensory: No sensory deficit.     Motor: No weakness or abnormal muscle tone.     Deep Tendon Reflexes: Reflexes are normal and symmetric.  Psychiatric:        Mood and Affect: Mood normal.     (all labs ordered are listed, but only abnormal results are displayed) Labs Reviewed  COMPREHENSIVE METABOLIC PANEL WITH GFR - Abnormal; Notable for the following components:      Result Value   Glucose, Bld 159 (*)    All other components within normal limits  CBC WITH DIFFERENTIAL/PLATELET  TSH  MAGNESIUM  TROPONIN I (HIGH SENSITIVITY)    EKG: EKG Interpretation Date/Time:  Tuesday June 25 2024 15:28:22 EST Ventricular Rate:  82 PR Interval:  168 QRS Duration:  106 QT Interval:  406 QTC Calculation: 475 R Axis:   40  Text Interpretation: Sinus rhythm when compared to prior, now in sinus rhythm from Afib with RVR earlier No STEMI Confirmed by Ginger Barefoot ([id number]) on 06/25/2024 5:12:06 PM  Radiology: No results found.   Procedures   Medications Ordered in the ED  sodium chloride  0.9 % bolus 1,000 mL (1,000 mLs Intravenous New Bag/Given 06/25/24 1554)                                    Medical Decision Making Amount and/or Complexity of Data Reviewed Labs: ordered.    Mercedes Khan is a 83 y.o. female with past medical  history significant for hypertension, hypothyroidism, previous cholecystectomy, previous thyroidectomy, diabetes, previous TIA, and previous intermittent atrial fibrillation on Eliquis  therapy who presents from cardiology office for further evaluation management of atrial fibrillation.  Patient reports that she had a cardiology appointment today and since last night has been having intermittent fast palpitations and lightheadedness/fatigue.  She denies any chest pain or shortness of breath.  She denies diaphoresis, nausea, or vomiting.  She denies any leg pain or leg swelling.  Denies trauma.  She denies any fevers, chills, congestion, cough, constipation, diarrhea, or urinary changes.  She specifically denies any dysuria or hematuria frequency or urgency.  Doubt UTI.  She otherwise feels well.  She denied any preceding symptoms before last night.  She went to her cardiologist office today and was found to be in A-fib with RVR with rates in the 150s and she was sent here for ED cardioversion.  She last ate some fruit about half an hour ago but otherwise denies any complaints.  She denies any recent medication changes in the last week and otherwise had a good Thanksgiving.  On exam, lungs clear.  Chest nontender.  No murmur.  Abdomen nontender.  Legs nontender nonedematous.  She has dry mucous membranes.  Patient resting comfortably without acute distress.  No focal neurologic deficits.  Symmetric smile.  Clear speech.  Will give her some fluids as she does have a dry mouth and will get some screening labs to look for egregious abnormality that would preclude her from getting cardioversion here.  As she has no chest pain or shortness of breath we will hold on chest x-ray.  Given the palpitations and lightheadedness I will get a troponin to make sure there is not a significant elevation however she has no edema so we will hold on BNP.  Will give a small amount of fluids.  If workup reassuring, anticipate ED  cardioversion as recommended by cardiology documentation from clinic today.  Patient's EKG here shows a sinus rhythm now.  Heart rate is in the 80s.  She has no complaints.  Will give her the fluids and make sure the labs are reassuring and if she prove stability, anticipate discharge home as she does not appear to need cardioversion.  5:55 PM Workup has returned reassuring and she is now proven stability for over 5 hours.  She has no palpitations or other symptoms and is still in sinus rhythm.  Heart rate is now in the 70s.  She tells me she would like to go home.  Will discharge for outpatient cardiology and PCP follow-up.  She no other questions or concerns and was discharged in good condition.     Final diagnoses:  Palpitations  Atrial fibrillation with RVR [facility])    ED Discharge Orders     None       Clinical Impression: 1. Palpitations   2. Atrial fibrillation with RVR (HCC)     Disposition: Discharge  Condition: Good  I have discussed the results, Dx and Tx plan with the pt(& family if present). He/she/they expressed understanding and agree(s) with the plan. Discharge instructions discussed at great length. Strict return precautions discussed and pt &/or family have verbalized understanding of the instructions. No further questions at time of discharge.    New Prescriptions   No medications on file    Follow Up: Valentin Skates, DO [address] [place] [address] [phone]     Mercedes Lonni CROME, MD [address] [place] [address] [phone]          Dwight Burdo, Lonni PARAS, MD 06/25/24 [phone]

## 2024-06-25 NOTE — Discharge Instructions (Signed)
 Your history, exam, workup today seem consistent with intermittent atrial fibrillation with RVR however during her stay in the emergency department you have been in a sinus rhythm and your workup was reassuring.  As you already converted yourself, we did not need to do ED cardioversion as they sent you here for.  We feel you are safe for discharge home and improvement stability for over 5 hours.  Please rest and stay hydrated and continue your home medication and follow-up with your cardiologist and PCP.  If any symptoms recur, change, or worsen acutely, please return to the nearest emergency department.

## 2024-06-27 ENCOUNTER — Other Ambulatory Visit: Payer: Self-pay

## 2024-06-27 ENCOUNTER — Other Ambulatory Visit (HOSPITAL_COMMUNITY): Payer: Self-pay

## 2024-06-28 ENCOUNTER — Other Ambulatory Visit (HOSPITAL_COMMUNITY): Payer: Self-pay

## 2024-06-28 MED ORDER — OZEMPIC (1 MG/DOSE) 4 MG/3ML ~~LOC~~ SOPN
1.0000 mg | PEN_INJECTOR | SUBCUTANEOUS | 3 refills | Status: AC
  Filled 2024-06-28 – 2024-07-12 (×2): qty 3, 28d supply, fill #0
  Filled 2024-08-12: qty 3, 28d supply, fill #1

## 2024-07-03 ENCOUNTER — Telehealth: Payer: Self-pay | Admitting: *Deleted

## 2024-07-03 NOTE — Telephone Encounter (Signed)
 The patient has been notified of the result and verbalized understanding.  All questions (if any) were answered. Joshua Dalton Seip, CMA 07/03/2024 4:36 PM     Patient asked to call her back in 3 weeks she has other health problems at this time.

## 2024-07-03 NOTE — Telephone Encounter (Signed)
-----   Message from Mercedes Khan sent at 06/04/2024  6:25 PM EST ----- No OSA but patient's O2 dropped too low - please set up for in lab CPAP titration

## 2024-07-10 ENCOUNTER — Other Ambulatory Visit (HOSPITAL_COMMUNITY): Payer: Self-pay

## 2024-07-12 ENCOUNTER — Ambulatory Visit (HOSPITAL_COMMUNITY): Admitting: Internal Medicine

## 2024-07-12 ENCOUNTER — Other Ambulatory Visit (HOSPITAL_COMMUNITY): Payer: Self-pay

## 2024-07-12 NOTE — Progress Notes (Incomplete)
 "  Primary Care Physician: Valentin Skates, DO Primary Cardiologist: Dr. Kate Electrophysiologist: None  Referring Physician: Dr. Kate Railing Hornung is a 83 y.o. female with a history of paroxysmal AF (on Eliquis ) HTN, hypothyroidism, prediabetes, OSA, TIA, who presents for follow up in the [facility].  The patient was initially diagnosed with atrial fibrillation by Dr. Kate after wearing a ZIO monitor x 14 days that showed 4% burden.  She was started on Eliquis  5 mg twice daily and carvedilol  with recommendations to pursue a sleep study.  She underwent an updated 2D echo that showed normal EF with mild biatrial enlargement with moderate to severe mitral annular calcification and mild aortic stenosis.  She was seen by Dr. Barnetta for follow-up on 06/25/2024 with complaint of fatigue and presyncope.  EKG was completed showing AF with RVR with rate of 150.  She reported no missed doses of Eliquis  and was referred to the ED for DCCV.  She was given some IV fluid and converted spontaneously sinus rhythm and did not require DCCV at that time.  With a full cardiac workup that was reassuring and was discharged i home in stable condition.  Patient presents today for follow up for atrial fibrillation. ***   Today, she denies symptoms of ***palpitations, chest pain, shortness of breath, orthopnea, PND, lower extremity edema, dizziness, presyncope, syncope, snoring, daytime somnolence, bleeding, or neurologic sequela. The patient is tolerating medications without difficulties and is otherwise without complaint today.   CHA2DS2-VASc score is 6 ***No LVH, CAD - Would be candidate for following AA: - Creatinine clearance: 55 mL/min- Tikosyn 250 mcg dose Flecainide Multaq Amiodarone   Atrial Fibrillation Risk Factors:  History of Sleep Apnea recent study showed no evidence of OSA History of early familial atrial fibrillation or other arrhythmias ***  Atrial  Fibrillation Management history:  Previous antiarrhythmic drugs: None Previous cardioversions: None Previous ablations: None Anticoagulation history: Eliquis   ROS- All systems are reviewed and negative except as per the HPI above.  Past Medical History:  Diagnosis Date   Allergy    Arthritis    Cancer (HCC)    skin   Gallstones    Hypertension    Hypothyroidism    PONV (postoperative nausea and vomiting)    only after tonsillectomy at age 75yrs   Pre-diabetes    Controlled with diet and exercise, last A1c 10-2016 was 8.7   Sleep apnea    use to wear a mask, lost 30 lbs    Current Outpatient Medications  Medication Sig Dispense Refill   apixaban  (ELIQUIS ) 5 MG TABS tablet Take 1 tablet (5 mg total) by mouth 2 (two) times daily. 60 tablet 3   aspirin EC 81 MG tablet Take 81 mg by mouth daily. Swallow whole. (Patient not taking: Reported on 06/25/2024)     carvedilol  (COREG ) 6.25 MG tablet Take 1 tablet (6.25 mg total) by mouth 2 (two) times daily. 180 tablet 3   Cholecalciferol (VITAMIN D PO) Take by mouth.     Cyanocobalamin (VITAMIN B 12 PO) Take 1 tablet by mouth daily at 6 (six) AM.     lisinopril (PRINIVIL,ZESTRIL) 30 MG tablet Take 30 mg by mouth daily.     rosuvastatin  (CRESTOR ) 5 MG tablet Take 1 tablet (5 mg total) by mouth daily. 90 tablet 3   Semaglutide , 1 MG/DOSE, (OZEMPIC , 1 MG/DOSE,) 4 MG/3ML SOPN Inject 1 mg into the skin once a week. 3 mL 3   Semaglutide ,0.25 or 0.5MG /DOS, (OZEMPIC ,  0.25 OR 0.5 MG/DOSE,) 2 MG/3ML SOPN Inject 0.5 mg into the skin once a week as directed. 3 mL 3   thyroid  (ARMOUR) 30 MG tablet Take 30 mg by mouth daily. (Patient taking differently: Take 30 mg by mouth.)     No current facility-administered medications for this visit.    Physical Exam: There were no vitals taken for this visit.  GEN: Well nourished, well developed in no acute distress NECK: No JVD; No carotid bruits CARDIAC: {EPRHYTHM:28826}, no murmurs, rubs,  gallops RESPIRATORY:  Clear to auscultation without rales, wheezing or rhonchi  ABDOMEN: Soft, non-tender, non-distended EXTREMITIES:  No edema; No deformity   Wt Readings from Last 3 Encounters:  06/25/24 72.5 kg  04/17/24 76.7 kg  03/21/24 76.3 kg     EKG today demonstrates:   EKG Interpretation Date/Time:    Ventricular Rate:    PR Interval:    QRS Duration:    QT Interval:    QTC Calculation:   R Axis:      Text Interpretation:          Echo Completed 12/20/2023:  1. Left ventricular ejection fraction, by estimation, is 60 to 65%. The  left ventricle has normal function. The left ventricle has no regional  wall motion abnormalities. There is mild concentric left ventricular  hypertrophy. Left ventricular diastolic  function could not be evaluated. The average left ventricular global  longitudinal strain is -15.4 %.   2. Right ventricular systolic function is normal. The right ventricular  size is normal. Tricuspid regurgitation signal is inadequate for assessing  PA pressure.   3. Left atrial size was mildly dilated.   4. Right atrial size was mildly dilated.   5. The mitral valve is degenerative. Trivial mitral valve regurgitation.  No evidence of mitral stenosis. Moderate to severe mitral annular  calcification.   6. The aortic valve is calcified. There is mild calcification of the  aortic valve. There is mild thickening of the aortic valve. Aortic valve  regurgitation is trivial. Mild aortic valve stenosis.   7. The inferior vena cava is normal in size with greater than 50%  respiratory variability, suggesting right atrial pressure of 3 mmHg.   CHA2DS2-VASc Score =    The patient's score is based upon:   {Click here to calculate score.  REFRESH note before signing. :1}     ASSESSMENT AND PLAN: {Select the correct AFib Diagnosis                 :[id number]}    HTN: BP well controlled. Continue current antihypertensive regimen.   HLD Continue  statin  TIA: - Continue current treatment plan with anticoagulant and statin as noted above    {Are you ordering a CV Procedure (e.g. stress test, cath, DCCV, TEE, etc)?   Press F2        :[id number]}  Follow up with the AF Clinic on***  Jackee Alberts, NP-C [facility] [address] [place], [place] [address] [phone] "

## 2024-07-15 ENCOUNTER — Other Ambulatory Visit (HOSPITAL_COMMUNITY): Payer: Self-pay

## 2024-07-16 ENCOUNTER — Other Ambulatory Visit (HOSPITAL_COMMUNITY): Payer: Self-pay

## 2024-08-12 ENCOUNTER — Other Ambulatory Visit: Payer: Self-pay | Admitting: Cardiology

## 2024-08-12 NOTE — Telephone Encounter (Addendum)
 Reached back out to patient and she has declined the titration stating she has other health problems including an first time visit to the Afib clinic.

## 2024-08-15 ENCOUNTER — Other Ambulatory Visit (HOSPITAL_COMMUNITY): Payer: Self-pay

## 2024-08-15 ENCOUNTER — Ambulatory Visit (HOSPITAL_COMMUNITY)
Admission: RE | Admit: 2024-08-15 | Discharge: 2024-08-15 | Disposition: A | Discharge status: Home / Self Care | Source: Ambulatory Visit | Attending: Internal Medicine | Admitting: Internal Medicine

## 2024-08-15 VITALS — BP 146/72 | HR 87 | Ht 62.0 in | Wt 153.0 lb

## 2024-08-15 DIAGNOSIS — I480 Paroxysmal atrial fibrillation: Secondary | ICD-10-CM

## 2024-08-15 DIAGNOSIS — D6869 Other thrombophilia: Secondary | ICD-10-CM | POA: Diagnosis not present

## 2024-08-15 NOTE — Progress Notes (Signed)
 "   Primary Care Physician: Valentin Skates, DO Primary Cardiologist: None Electrophysiologist: None     Referring Physician: Dr. Kate Railing Gartland is a 84 y.o. female with a history of HTN, hypothyroidism, prediabetes, OSA on CPAP, TIA, and paroxysmal atrial fibrillation who presents for consultation in the [facility].  Patient seen by cardiologist on 06/25/2024 and noted to be in A-fib with RVR and patient was presyncopal.  She went to the ED the same day and had spontaneously converted to NSR.  Patient is on Eliquis  5 mg twice daily for stroke prevention.  On evaluation today, patient is currently in NSR.  She is taking Coreg  6.25 mg twice daily.  She has not noticed any additional episodes since the day of ED visit.  She has not missed any doses of Eliquis .  Today, she denies symptoms of chest pain, shortness of breath, orthopnea, PND, lower extremity edema, dizziness, presyncope, syncope, bleeding, or neurologic sequela. The patient is tolerating medications without difficulties and is otherwise without complaint today.   she has a BMI of Body mass index is 27.98 kg/m.SABRA Filed Weights   08/15/24 1004  Weight: 69.4 kg    Current Outpatient Medications  Medication Sig Dispense Refill   carvedilol  (COREG ) 6.25 MG tablet Take 1 tablet (6.25 mg total) by mouth 2 (two) times daily. 180 tablet 3   Cholecalciferol (VITAMIN D PO) Take 1 tablet by mouth daily.     Cyanocobalamin (VITAMIN B 12 PO) Take 1 tablet by mouth daily at 6 (six) AM.     ELIQUIS  5 MG TABS tablet TAKE 1 TABLET BY MOUTH 2 TIMES DAILY. 60 tablet 3   lisinopril (PRINIVIL,ZESTRIL) 30 MG tablet Take 30 mg by mouth daily.     rosuvastatin  (CRESTOR ) 5 MG tablet Take 1 tablet (5 mg total) by mouth daily. 90 tablet 3   Semaglutide , 1 MG/DOSE, (OZEMPIC , 1 MG/DOSE,) 4 MG/3ML SOPN Inject 1 mg into the skin once a week. 3 mL 3   Semaglutide ,0.25 or 0.5MG /DOS, (OZEMPIC , 0.25 OR 0.5 MG/DOSE,) 2  MG/3ML SOPN Inject 0.5 mg into the skin once a week as directed. 3 mL 3   thyroid  (ARMOUR) 30 MG tablet Take 30 mg by mouth daily.     aspirin EC 81 MG tablet Take 81 mg by mouth daily. Swallow whole. (Patient not taking: Reported on 06/25/2024)     No current facility-administered medications for this encounter.    Atrial Fibrillation Management history:  Previous antiarrhythmic drugs: none Previous cardioversions: none Previous ablations: none Anticoagulation history: Eliquis    ROS- All systems are reviewed and negative except as per the HPI above.  Physical Exam: BP (!) 146/72   Pulse 87   Ht 5' 2 (1.575 m)   Wt 69.4 kg   BMI 27.98 kg/m   GEN: Well nourished, well developed in no acute distress NECK: No JVD; No carotid bruits CARDIAC: Regular rate and rhythm, no murmurs, rubs, gallops RESPIRATORY:  Clear to auscultation without rales, wheezing or rhonchi  ABDOMEN: Soft, non-tender, non-distended EXTREMITIES:  No edema; No deformity   EKG today demonstrates   EKG Interpretation Date/Time:  Thursday August 15 2024 10:07:09 EST Ventricular Rate:  87 PR Interval:  162 QRS Duration:  94 QT Interval:  426 QTC Calculation: 512 R Axis:   57  Text Interpretation: Normal sinus rhythm Low voltage QRS Nonspecific ST abnormality Prolonged QT Abnormal ECG When compared with ECG of 25-Jun-2024 15:28, PREVIOUS ECG IS PRESENT Confirmed  by Terra Pac [phone]) on 08/15/2024 10:57:49 AM        Echo 12/20/23 demonstrated  1. Left ventricular ejection fraction, by estimation, is 60 to 65%. The  left ventricle has normal function. The left ventricle has no regional  wall motion abnormalities. There is mild concentric left ventricular  hypertrophy. Left ventricular diastolic  function could not be evaluated. The average left ventricular global  longitudinal strain is -15.4 %.   2. Right ventricular systolic function is normal. The right ventricular  size is normal. Tricuspid  regurgitation signal is inadequate for assessing  PA pressure.   3. Left atrial size was mildly dilated.   4. Right atrial size was mildly dilated.   5. The mitral valve is degenerative. Trivial mitral valve regurgitation.  No evidence of mitral stenosis. Moderate to severe mitral annular  calcification.   6. The aortic valve is calcified. There is mild calcification of the  aortic valve. There is mild thickening of the aortic valve. Aortic valve  regurgitation is trivial. Mild aortic valve stenosis.   7. The inferior vena cava is normal in size with greater than 50%  respiratory variability, suggesting right atrial pressure of 3 mmHg.   ASSESSMENT & PLAN CHA2DS2-VASc Score = 6  The patient's score is based upon: CHF History: 0 HTN History: 1 Diabetes History: 0 Stroke History: 2 Vascular Disease History: 0 Age Score: 2 Gender Score: 1       ASSESSMENT AND PLAN: Paroxysmal Atrial Fibrillation (ICD10:  I48.0) The patient's CHA2DS2-VASc score is 6, indicating a 9.7% annual risk of stroke.    Patient is currently in NSR.  We discussed multiple rhythm control options today including  antiarrhythmic drug therapy and possible ablation.  We discussed possible options such as Multaq, flecainide, or amiodarone.  We discussed the possible adverse effects and monitoring required with these medications.  We also discussed pulsed field ablation and what to expect during the recovery period.  After discussion, patient is not interested in trying any of the antiarrhythmic medications or ablation at this time.  She wishes to just continue her Coreg  twice daily.  I noted to patient if she has increased paroxysmal burden, she may need to reconsider these options.  I do not believe she would be a good candidate for Tikosyn due to her prolonged QTc at baseline.  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   PR 150 ms Qtc 472 ms  CrCl 55 mL/min   Secondary Hypercoagulable State (ICD10:  D68.69) The patient is at significant risk for stroke/thromboembolism based upon her CHA2DS2-VASc Score of 6.  Continue Apixaban  (Eliquis ).   Continue Eliquis  5 mg twice daily.  Dosage is correct based on weight greater than 60 kg and creatinine less than 1.5.   Follow up Afib clinic prn.   Terra Pac, [facility]  Afib Clinic [address] [place], [place] [address] [phone]  "
